# Patient Record
Sex: Female | Born: 1980 | Hispanic: No | Marital: Married | Smoking: Never smoker
Health system: Southern US, Community
[De-identification: ages and names within clinical notes are randomized; demographics above are authoritative.]

## PROBLEM LIST (undated history)

## (undated) DIAGNOSIS — L709 Acne, unspecified: Secondary | ICD-10-CM

## (undated) HISTORY — DX: Acne, unspecified: L70.9

---

## 2006-07-29 ENCOUNTER — Ambulatory Visit: Payer: Self-pay | Admitting: Infectious Diseases

## 2006-08-03 ENCOUNTER — Ambulatory Visit: Payer: Self-pay | Admitting: Infectious Diseases

## 2006-08-03 LAB — CONVERTED CEMR LAB
Albumin: 4.5 g/dL (ref 3.5–5.2)
Alkaline Phosphatase: 53 units/L (ref 39–117)
Basophils Absolute: 0.1 10*3/uL (ref 0.0–0.1)
CO2: 28 meq/L (ref 19–32)
Creatinine, Ser: 0.94 mg/dL (ref 0.40–1.20)
Eosinophils Absolute: 0.6 cells/mcL (ref 0.0–0.7)
Eosinophils Relative: 5 % (ref 0–5)
Glucose, Bld: 81 mg/dL (ref 70–99)
Hemoglobin: 14.4 g/dL (ref 12.0–15.0)
Leukocyte count, blood: 10.6 10*9/L — ABNORMAL HIGH (ref 4.0–10.5)
Lymphs Abs: 2.5 10*3/uL (ref 0.7–3.3)
Monocytes Absolute: 0.5 10*3/uL (ref 0.2–0.7)
Monocytes Relative: 5 % (ref 3–11)
Neutro Abs: 7 10*3/uL (ref 1.7–7.7)
Neutrophils Relative %: 66 % (ref 43–77)
RBC: 5.15 M/uL — ABNORMAL HIGH (ref 3.87–5.11)
Sodium: 141 meq/L (ref 135–145)
Total Bilirubin: 0.3 mg/dL (ref 0.3–1.2)

## 2006-09-06 ENCOUNTER — Ambulatory Visit: Payer: Self-pay | Admitting: Infectious Diseases

## 2006-09-07 ENCOUNTER — Ambulatory Visit: Payer: Self-pay | Admitting: Infectious Diseases

## 2006-09-14 ENCOUNTER — Ambulatory Visit: Payer: Self-pay | Admitting: Infectious Diseases

## 2006-10-14 ENCOUNTER — Encounter (INDEPENDENT_AMBULATORY_CARE_PROVIDER_SITE_OTHER): Payer: Self-pay | Admitting: Infectious Diseases

## 2007-02-06 ENCOUNTER — Emergency Department (HOSPITAL_COMMUNITY): Admission: EM | Admit: 2007-02-06 | Discharge: 2007-02-07 | Payer: Self-pay | Admitting: Emergency Medicine

## 2007-03-02 ENCOUNTER — Encounter: Admission: RE | Admit: 2007-03-02 | Discharge: 2007-03-21 | Payer: Self-pay | Admitting: Family Medicine

## 2008-08-07 ENCOUNTER — Other Ambulatory Visit: Admission: RE | Admit: 2008-08-07 | Discharge: 2008-08-07 | Payer: Self-pay | Admitting: Gynecology

## 2008-08-07 ENCOUNTER — Encounter: Payer: Self-pay | Admitting: Gynecology

## 2008-08-07 ENCOUNTER — Ambulatory Visit: Payer: Self-pay | Admitting: Gynecology

## 2008-08-10 ENCOUNTER — Ambulatory Visit: Payer: Self-pay | Admitting: Gynecology

## 2008-08-23 ENCOUNTER — Ambulatory Visit: Payer: Self-pay | Admitting: Gynecology

## 2008-08-23 ENCOUNTER — Ambulatory Visit (HOSPITAL_COMMUNITY): Admission: RE | Admit: 2008-08-23 | Discharge: 2008-08-23 | Payer: Self-pay | Admitting: Gynecology

## 2008-09-25 ENCOUNTER — Ambulatory Visit: Payer: Self-pay | Admitting: Gynecology

## 2008-10-26 ENCOUNTER — Ambulatory Visit: Payer: Self-pay | Admitting: Gynecology

## 2008-12-07 ENCOUNTER — Ambulatory Visit: Payer: Self-pay | Admitting: Gynecology

## 2009-07-26 ENCOUNTER — Ambulatory Visit: Payer: Self-pay | Admitting: Gynecology

## 2009-08-22 ENCOUNTER — Ambulatory Visit: Payer: Self-pay | Admitting: Gynecology

## 2009-08-22 ENCOUNTER — Other Ambulatory Visit: Admission: RE | Admit: 2009-08-22 | Discharge: 2009-08-22 | Payer: Self-pay | Admitting: Gynecology

## 2009-08-22 ENCOUNTER — Encounter: Payer: Self-pay | Admitting: Gynecology

## 2010-02-24 ENCOUNTER — Ambulatory Visit: Payer: Self-pay | Admitting: Gynecology

## 2010-02-27 ENCOUNTER — Ambulatory Visit: Payer: Self-pay | Admitting: Gynecology

## 2010-05-27 ENCOUNTER — Ambulatory Visit: Payer: Self-pay | Admitting: Gynecology

## 2010-06-23 ENCOUNTER — Ambulatory Visit: Payer: Self-pay | Admitting: Gynecology

## 2010-06-27 ENCOUNTER — Ambulatory Visit: Payer: Self-pay | Admitting: Gynecology

## 2010-07-15 ENCOUNTER — Ambulatory Visit: Payer: Self-pay | Admitting: Gynecology

## 2010-07-23 ENCOUNTER — Ambulatory Visit: Payer: Self-pay | Admitting: Gynecology

## 2010-07-26 ENCOUNTER — Observation Stay (HOSPITAL_COMMUNITY): Admission: EM | Admit: 2010-07-26 | Discharge: 2010-07-26 | Payer: Self-pay | Admitting: Emergency Medicine

## 2010-08-21 IMAGING — RF DG HYSTEROGRAM
5 series · 5 of 5 positions shown · IV contrast (omnipaque)
Comparison: none

CLINICAL DATA: Primary infertility.  Assess tubal patency.

HYSTEROSALPINGOGRAM
TECHNIQUE: Following cleansing of the cervix and vagina with
Betadine solution, a hysterosalpingogram was performed using a 5-
French hysterosalpingogram catheter and Omnipaque 300 contrast. The
patient tolerated the examination without difficulty.
Fluoroscopy time: 3.0 minutes.

[Series 1: run · 1 of 1 slices shown (1 of 5)]
[im 1/1]
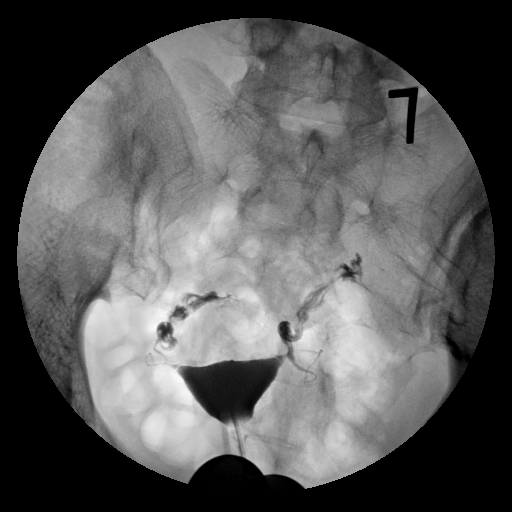

[Series 2: run · 1 of 1 slices shown (2 of 5)]
[im 1/1]
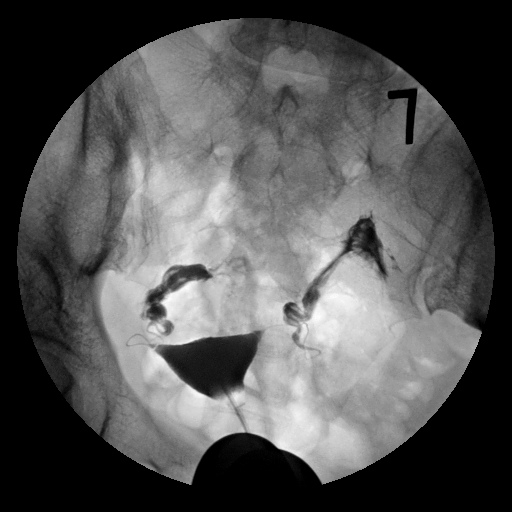

[Series 3: run · 1 of 1 slices shown (3 of 5)]
[im 1/1]
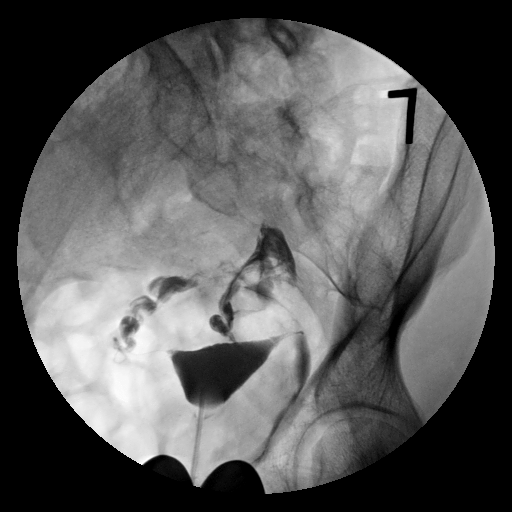

[Series 4: run · 1 of 1 slices shown (4 of 5)]
[im 1/1]
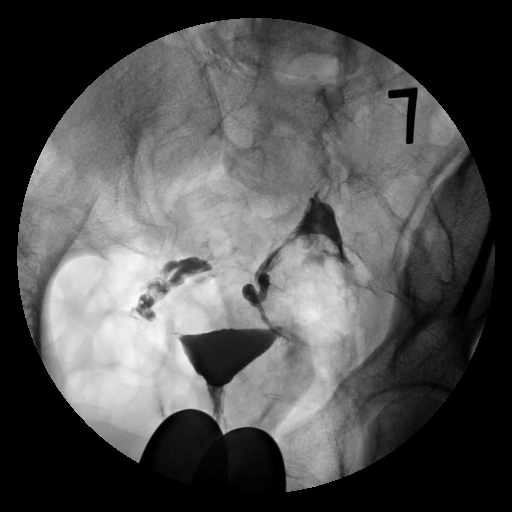

[Series 5: run · 1 of 1 slices shown (5 of 5)]
[im 1/1]
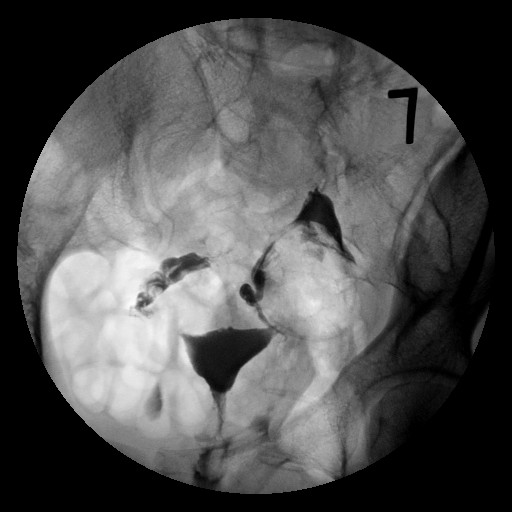

[5 of 5 positions shown; findings below may reference images not displayed]

FINDINGS: A normal endometrial morphology is noted.  Both fallopian
tubes have a normal morphology and bilateral free intraperitoneal
spill is documented.  No evidence for loculation of contrast was
seen to suggest the presence of peritubal or periovarian adhesions.
IMPRESSION: Normal HSG.

## 2010-12-24 LAB — POCT I-STAT, CHEM 8
BUN: 9 mg/dL (ref 6–23)
Calcium, Ion: 1.14 mmol/L (ref 1.12–1.32)
Chloride: 104 mEq/L (ref 96–112)
HCT: 46 % (ref 36.0–46.0)
Hemoglobin: 15.6 g/dL — ABNORMAL HIGH (ref 12.0–15.0)
Sodium: 141 mEq/L (ref 135–145)
TCO2: 19 mmol/L (ref 0–100)

## 2010-12-24 LAB — URINALYSIS, ROUTINE W REFLEX MICROSCOPIC
Bilirubin Urine: NEGATIVE
Glucose, UA: NEGATIVE mg/dL
Hgb urine dipstick: NEGATIVE
Ketones, ur: >80 mg/dL — AB
Nitrite: NEGATIVE
Specific Gravity, Urine: 1.02 (ref 1.005–1.030)

## 2010-12-24 LAB — HCG, QUANTITATIVE, PREGNANCY: hCG, Beta Chain, Quant, S: 90395 m[IU]/mL — ABNORMAL HIGH (ref <5)

## 2010-12-24 LAB — URINE CULTURE: Culture  Setup Time: 201110152058

## 2010-12-24 LAB — DIFFERENTIAL
Eosinophils Absolute: 0.2 10*3/uL (ref 0.0–0.7)
Lymphs Abs: 2.3 10*3/uL (ref 0.7–4.0)

## 2010-12-24 LAB — URINE MICROSCOPIC-ADD ON

## 2010-12-24 LAB — CBC
Hemoglobin: 13.8 g/dL (ref 12.0–15.0)
MCHC: 35.4 g/dL (ref 30.0–36.0)

## 2011-03-08 ENCOUNTER — Inpatient Hospital Stay (HOSPITAL_COMMUNITY)
Admission: AD | Admit: 2011-03-08 | Discharge: 2011-03-10 | DRG: 775 | Disposition: A | Discharge status: Home / Self Care | Payer: 59 | Source: Ambulatory Visit | Attending: Obstetrics & Gynecology | Admitting: Obstetrics & Gynecology

## 2011-03-08 DIAGNOSIS — O99892 Other specified diseases and conditions complicating childbirth: Secondary | ICD-10-CM | POA: Diagnosis present

## 2011-03-08 DIAGNOSIS — Z22330 Carrier of Group B streptococcus: Secondary | ICD-10-CM

## 2011-03-08 LAB — CBC
MCH: 26.2 pg (ref 26.0–34.0)
MCV: 78.9 fL (ref 78.0–100.0)
Platelets: 242 10*3/uL (ref 150–400)
RBC: 4.78 MIL/uL (ref 3.87–5.11)
WBC: 11 10*3/uL — ABNORMAL HIGH (ref 4.0–10.5)

## 2011-03-09 LAB — CBC
Hemoglobin: 9.2 g/dL — ABNORMAL LOW (ref 12.0–15.0)
MCH: 26 pg (ref 26.0–34.0)
MCHC: 33 g/dL (ref 30.0–36.0)

## 2011-10-10 ENCOUNTER — Encounter: Payer: Self-pay | Admitting: Emergency Medicine

## 2011-10-10 ENCOUNTER — Emergency Department (HOSPITAL_COMMUNITY)
Admission: EM | Admit: 2011-10-10 | Discharge: 2011-10-10 | Disposition: A | Discharge status: Home / Self Care | Payer: BC Managed Care – PPO | Source: Home / Self Care | Attending: Emergency Medicine | Admitting: Emergency Medicine

## 2011-10-10 DIAGNOSIS — N949 Unspecified condition associated with female genital organs and menstrual cycle: Secondary | ICD-10-CM

## 2011-10-10 DIAGNOSIS — N938 Other specified abnormal uterine and vaginal bleeding: Secondary | ICD-10-CM

## 2011-10-10 LAB — POCT URINALYSIS DIP (DEVICE)
Glucose, UA: NEGATIVE mg/dL
Urobilinogen, UA: 0.2 mg/dL (ref 0.0–1.0)
pH: 6.5 (ref 5.0–8.0)

## 2011-10-10 LAB — WET PREP, GENITAL
Clue Cells Wet Prep HPF POC: NONE SEEN
Yeast Wet Prep HPF POC: NONE SEEN

## 2011-10-10 LAB — POCT PREGNANCY, URINE: Preg Test, Ur: NEGATIVE

## 2011-10-10 MED ORDER — HYDROCODONE-ACETAMINOPHEN 5-325 MG PO TABS: 2.0000 | ORAL_TABLET | Freq: Once | ORAL | Status: DC

## 2011-10-10 NOTE — ED Notes (Signed)
Vaginal bleeding 12/14-12/29

## 2011-10-10 NOTE — ED Provider Notes (Signed)
History     CSN: [id number]  Arrival date & time 10/10/11  1725   First MD Initiated Contact with Patient 10/10/11 1834      No chief complaint on file.   (Consider location/radiation/quality/duration/timing/severity/associated sxs/prior treatment) Patient is a 30 y.o. female presenting with vaginal bleeding. The history is provided by the patient. No language interpreter was used.  Vaginal Bleeding This is a new problem. The current episode started more than 1 week ago. The problem occurs constantly. The problem has not changed since onset.The symptoms are aggravated by nothing. The symptoms are relieved by nothing. The treatment provided no relief.  Pt had a baby 7 months ago.  Pt complains of bleeding x 2 weeks.   No past medical history on file.  No past surgical history on file.  No family history on file.  History  Substance Use Topics  . Smoking status: Not on file  . Smokeless tobacco: Not on file  . Alcohol Use: Not on file    OB History    No data available      Review of Systems  Genitourinary: Positive for vaginal bleeding.  All other systems reviewed and are negative.    Allergies  Review of patient's allergies indicates not on file.  Home Medications  No current outpatient prescriptions on file.  BP 131/77  Pulse 66  Temp(Src) 98.2 F (36.8 C) (Oral)  Resp 17  SpO2 100%  Physical Exam  Nursing note and vitals reviewed. Constitutional: She is oriented to person, place, and time. She appears well-developed and well-nourished.  HENT:  Head: Normocephalic.  Eyes: Pupils are equal, round, and reactive to light.  Neck: Normal range of motion.  Cardiovascular: Normal rate.   Pulmonary/Chest: Effort normal.  Abdominal: Soft.  Genitourinary: Uterus normal.       Mod vaginal bleeding.  Endometrial tissue is protruding thru os.  (small amount) no mass,    Musculoskeletal: Normal range of motion.  Neurological: She is alert and oriented to  person, place, and time.  Skin: Skin is warm.  Psychiatric: She has a normal mood and affect.    ED Course  Procedures (including critical care time)  Labs Reviewed  POCT URINALYSIS DIP (DEVICE) - Abnormal; Notable for the following:    Hgb urine dipstick LARGE (*)    Protein, ur 30 (*)    All other components within normal limits  POCT PREGNANCY, URINE  POCT PREGNANCY, URINE  POCT URINALYSIS DIPSTICK  WET PREP, GENITAL  GC/CHLAMYDIA PROBE AMP, GENITAL   No results found.   1. Dysfunctional uterine bleeding       MDM  I advised pt to see Dr. Arlyce Dice for ebvaluation.  I advised [facility] to [facility] to [facility] if symptoms worsen or change.       Langston Masker, [place] 10/10/11 2021  Langston Masker, [place] 10/10/11 2022

## 2011-10-10 NOTE — ED Provider Notes (Signed)
Medical screening examination/treatment/procedure(s) were performed by non-physician practitioner and as supervising physician I was immediately available for consultation/collaboration.  Hillery Hunter, MD 10/10/11 2200

## 2011-10-10 NOTE — ED Notes (Signed)
Leah Monroe, emt assisted with pelvic exam 

## 2011-10-12 LAB — GC/CHLAMYDIA PROBE AMP, GENITAL: Chlamydia, DNA Probe: NEGATIVE

## 2013-05-27 ENCOUNTER — Ambulatory Visit (INDEPENDENT_AMBULATORY_CARE_PROVIDER_SITE_OTHER): Payer: BC Managed Care – PPO | Admitting: Family Medicine

## 2013-05-27 VITALS — BP 122/74 | HR 93 | Temp 98.6°F | Resp 18 | Ht 58.5 in | Wt 124.8 lb

## 2013-05-27 DIAGNOSIS — J069 Acute upper respiratory infection, unspecified: Secondary | ICD-10-CM

## 2013-05-27 MED ORDER — HYDROCODONE-HOMATROPINE 5-1.5 MG/5ML PO SYRP: 5.0000 mL | ORAL_SOLUTION | Freq: Three times a day (TID) | ORAL | Status: DC | PRN

## 2013-05-27 MED ORDER — AZITHROMYCIN 250 MG PO TABS: ORAL_TABLET | ORAL | Status: DC

## 2013-05-27 NOTE — Progress Notes (Signed)
Leah Monroe MRN: [id number], DOB: 01/14/1981, 32 y.o. Date of Encounter: 05/27/2013, 12:53 PM  Primary Physician: No PCP Per Patient  Chief Complaint:  Chief Complaint  Patient presents with  . Sore Throat    x 1 week    HPI: 32 y.o. year old female presents with a 7 day history of nasal congestion, post nasal drip, sore throat, and cough. Mild sinus pressure. Afebrile. No chills. Nasal congestion thick and green/yellow. Cough is productive of green/yellow sputum and not associated with time of day. Ears feel full, leading to sensation of muffled hearing. Has tried OTC cold preps without success. No GI complaints.   No sick contacts, recent antibiotics, or recent travels.   No leg trauma, sedentary periods, h/o cancer, or tobacco use.  History reviewed. No pertinent past medical history.   Home Meds: Prior to Admission medications   Medication Sig Start Date End Date Taking? Authorizing Provider  azithromycin (ZITHROMAX Z-PAK) 250 MG tablet Take as directed on pack 05/27/13   Elvina Sidle, MD  HYDROcodone-homatropine [facility]) 5-1.5 MG/5ML syrup Take 5 mL by mouth every 8 (eight) hours as needed for cough. 05/27/13   Elvina Sidle, MD    Allergies: No Known Allergies  History   Social History  . Marital Status: Married    Spouse Name: N/A    Number of Children: N/A  . Years of Education: N/A   Occupational History  . Not on file.   Social History Main Topics  . Smoking status: Never Smoker   . Smokeless tobacco: Not on file  . Alcohol Use: No  . Drug Use: No  . Sexual Activity: Not on file   Other Topics Concern  . Not on file   Social History Narrative  . No narrative on file     Review of Systems: Constitutional: negative for chills, fever, night sweats or weight changes Cardiovascular: negative for chest pain or palpitations Respiratory: negative for hemoptysis, wheezing, or shortness of breath Abdominal: negative for abdominal pain, nausea, vomiting or  diarrhea Dermatological: negative for rash Neurologic: negative for headache   Physical Exam: Blood pressure 122/74, pulse 93, temperature 98.6 F (37 C), temperature source Oral, resp. rate 18, height 4' 10.5" (1.486 m), weight 124 lb 12.8 oz (56.609 kg), last menstrual period 05/20/2013, SpO2 100.00%., Body mass index is 25.64 kg/(m^2). General: Well developed, well nourished, in no acute distress. Head: Normocephalic, atraumatic, eyes without discharge, sclera non-icteric, nares are congested. Bilateral auditory canals clear, TM's are without perforation, pearly grey with reflective cone of light bilaterally. No sinus TTP. Oral cavity moist, dentition normal. Posterior pharynx with post nasal drip and mild erythema. No peritonsillar abscess or tonsillar exudate. Neck: Supple. No thyromegaly. Full ROM. No lymphadenopathy. Lungs: Coarse breath sounds bilaterally without wheezes, rales, or rhonchi. Breathing is unlabored.  Heart: RRR with S1 S2. No murmurs, rubs, or gallops appreciated. Msk:  Strength and tone normal for age. Extremities: No clubbing or cyanosis. No edema. Neuro: Alert and oriented X 3. Moves all extremities spontaneously. CNII-XII grossly in tact. Psych:  Responds to questions appropriately with a normal affect.   Labs:   ASSESSMENT AND PLAN:  32 y.o. year old female with bronchitis and pharyngitis, worsening, x 1 week URI, acute - Plan: HYDROcodone-homatropine (HYCODAN) 5-1.5 MG/5ML syrup, azithromycin (ZITHROMAX Z-PAK) 250 MG tablet, DISCONTINUED: azithromycin (ZITHROMAX Z-PAK) 250 MG tablet  Signed, Elvina Sidle, MD   - -Tylenol/Motrin prn -Rest/fluids -RTC precautions -RTC 3-5 days if no improvement  Signed, Elvina Sidle, MD 05/27/2013  12:53 PM

## 2014-10-06 ENCOUNTER — Ambulatory Visit (INDEPENDENT_AMBULATORY_CARE_PROVIDER_SITE_OTHER): Payer: BC Managed Care – PPO | Admitting: Family Medicine

## 2014-10-06 VITALS — BP 110/70 | HR 75 | Temp 98.1°F | Resp 16 | Ht 59.0 in | Wt 125.8 lb

## 2014-10-06 DIAGNOSIS — N926 Irregular menstruation, unspecified: Secondary | ICD-10-CM

## 2014-10-06 LAB — POCT CBC
GRANULOCYTE PERCENT: 60.6 % (ref 37–80)
HEMATOCRIT: 44.9 % (ref 37.7–47.9)
HEMOGLOBIN: 14.4 g/dL (ref 12.2–16.2)
LYMPH, POC: 2.3 (ref 0.6–3.4)
MCH: 27.5 pg (ref 27–31.2)
MCHC: 32 g/dL (ref 31.8–35.4)
MCV: 85.9 fL (ref 80–97)
MID (cbc): 0.4 (ref 0–0.9)
MPV: 8.2 fL (ref 0–99.8)
POC Granulocyte: 4.2 (ref 2–6.9)
POC LYMPH %: 33 % (ref 10–50)
POC MID %: 6.4 %M (ref 0–12)
Platelet Count, POC: 229 10*3/uL (ref 142–424)
RBC: 5.23 M/uL (ref 4.04–5.48)
RDW, POC: 14.1 %
WBC: 7 10*3/uL (ref 4.6–10.2)

## 2014-10-06 LAB — POCT URINE PREGNANCY: PREG TEST UR: NEGATIVE

## 2014-10-06 MED ORDER — DESOGESTREL-ETHINYL ESTRADIOL 0.15-30 MG-MCG PO TABS: 1.0000 | ORAL_TABLET | Freq: Every day | ORAL | Status: DC

## 2014-10-06 NOTE — Progress Notes (Signed)
Subjective:    Patient ID: Leah Monroe, female    DOB: 05/15/1981, 33 y.o.   MRN: [id number]  10/06/2014  Menstrual Problem   HPI This 33 y.o. female presents for evaluation of irregular vaginal bleeding.  Onset of bleeding 09/12/2014; continues to bleed.  History of irregular vaginal bleeding in past in 2012.  Since 2012 episode, has had regular menses.  Normally, menses last 3-4 days; medium flow; intermittent cramping.  LMP before December was 08-19-2014 normal.  Current bleeding is normal but has continued; no heavy bleeding; no cramping.  +Daily bleeding.  No vaginal discharge, itching, burning.  +Married;no new sexual partners.  No contraception; desires conception in future.  No abdominal pain.  Last pap smear 2012 and reports normal.  No other sites of bleeding. One child; four year old child.    PCP:  None Gynecologist: none   Review of Systems  Constitutional: Negative for fever, chills, diaphoresis and fatigue.  HENT: Negative for nosebleeds.   Gastrointestinal: Negative for nausea, vomiting, abdominal pain and blood in stool.  Genitourinary: Positive for vaginal bleeding and menstrual problem. Negative for dysuria, urgency, frequency, hematuria, flank pain, decreased urine volume, vaginal discharge, genital sores, vaginal pain and pelvic pain.  Hematological: Does not bruise/bleed easily.    History reviewed. No pertinent past medical history. History reviewed. No pertinent past surgical history. No Known Allergies      Objective:    BP 110/70 mmHg  Pulse 75  Temp(Src) 98.1 F (36.7 C) (Oral)  Resp 16  Ht 4\' 11"  (1.499 m)  Wt 125 lb 12.8 oz (57.063 kg)  BMI 25.40 kg/m2  SpO2 100%  LMP 09/11/2014 Physical Exam  Constitutional: She is oriented to person, place, and time. She appears well-developed and well-nourished. No distress.  HENT:  Head: Normocephalic and atraumatic.  Eyes: Conjunctivae are normal. Pupils are equal, round, and reactive to light.  Neck: Normal  range of motion. Neck supple.  Cardiovascular: Normal rate, regular rhythm and normal heart sounds.  Exam reveals no gallop and no friction rub.   No murmur heard. Pulmonary/Chest: Effort normal and breath sounds normal. She has no wheezes. She has no rales.  Abdominal: Soft. Bowel sounds are normal. She exhibits no distension and no mass. There is no tenderness. There is no rebound and no guarding.  Genitourinary: Vagina normal and uterus normal. There is no rash, tenderness, lesion or injury on the right labia. There is no rash, tenderness, lesion or injury on the left labia. Cervix exhibits no motion tenderness, no discharge and no friability. Right adnexum displays no mass, no tenderness and no fullness. Left adnexum displays no mass, no tenderness and no fullness. No erythema or tenderness in the vagina. No foreign body around the vagina. No signs of injury around the vagina. No vaginal discharge found.  +bleeding from cervical os.  Neurological: She is alert and oriented to person, place, and time.  Skin: She is not diaphoretic.  Psychiatric: She has a normal mood and affect. Her behavior is normal.  Nursing note and vitals reviewed.  Results for orders placed or performed in visit on 10/06/14  GC/Chlamydia Probe Amp  Result Value Ref Range   CT Probe RNA NEGATIVE    GC Probe RNA NEGATIVE   POCT CBC  Result Value Ref Range   WBC 7.0 4.6 - 10.2 K/uL   Lymph, poc 2.3 0.6 - 3.4   POC LYMPH PERCENT 33.0 10 - 50 %L   MID (cbc) 0.4 0 - 0.9  POC MID % 6.4 0 - 12 %M   POC Granulocyte 4.2 2 - 6.9   Granulocyte percent 60.6 37 - 80 %G   RBC 5.23 4.04 - 5.48 M/uL   Hemoglobin 14.4 12.2 - 16.2 g/dL   HCT, POC 91.444.9 78.237.7 - 47.9 %   MCV 85.9 80 - 97 fL   MCH, POC 27.5 27 - 31.2 pg   MCHC 32.0 31.8 - 35.4 g/dL   RDW, POC 95.614.1 %   Platelet Count, POC 229 142 - 424 K/uL   MPV 8.2 0 - 99.8 fL  POCT urine pregnancy  Result Value Ref Range   Preg Test, Ur Negative        Assessment & Plan:    1. Menstrual periods irregular      1. Irregular menstrual cycle: New. In 33 year old.  Refer to gynecology for pap smear, endometrial biopsy, pelvic ultrasound.  Rx for Apri provided to take for one month to stop menses.  Hemodynamically stable.  Desires conception in future.   Meds ordered this encounter  Medications  . desogestrel-ethinyl estradiol (APRI,EMOQUETTE,SOLIA) 0.15-30 MG-MCG tablet    Sig: Take 1 tablet by mouth daily.    Dispense:  1 Package    Refill:  0    No Follow-up on file.     Nilda SimmerKristi Bijal Siglin, M.D.  [facility]  [place] [address] [place], [place]  [id number] [phone] phone [phone] fax

## 2014-10-08 LAB — GC/CHLAMYDIA PROBE AMP
CT PROBE, AMP APTIMA: NEGATIVE
GC Probe RNA: NEGATIVE

## 2014-10-18 ENCOUNTER — Other Ambulatory Visit (HOSPITAL_COMMUNITY)
Admission: RE | Admit: 2014-10-18 | Discharge: 2014-10-18 | Disposition: A | Discharge status: Home / Self Care | Payer: BLUE CROSS/BLUE SHIELD | Source: Ambulatory Visit | Attending: Gynecology | Admitting: Gynecology

## 2014-10-18 ENCOUNTER — Encounter: Payer: Self-pay | Admitting: Women's Health

## 2014-10-18 ENCOUNTER — Ambulatory Visit (INDEPENDENT_AMBULATORY_CARE_PROVIDER_SITE_OTHER): Payer: BLUE CROSS/BLUE SHIELD | Admitting: Women's Health

## 2014-10-18 VITALS — BP 120/80 | Ht 59.0 in | Wt 125.0 lb

## 2014-10-18 DIAGNOSIS — N926 Irregular menstruation, unspecified: Secondary | ICD-10-CM

## 2014-10-18 DIAGNOSIS — Z01419 Encounter for gynecological examination (general) (routine) without abnormal findings: Secondary | ICD-10-CM | POA: Insufficient documentation

## 2014-10-18 DIAGNOSIS — Z833 Family history of diabetes mellitus: Secondary | ICD-10-CM

## 2014-10-18 LAB — TSH: TSH: 1.812 u[IU]/mL (ref 0.350–4.500)

## 2014-10-18 LAB — GLUCOSE, RANDOM: GLUCOSE: 80 mg/dL (ref 70–99)

## 2014-10-18 MED ORDER — MEDROXYPROGESTERONE ACETATE 10 MG PO TABS: 10.0000 mg | ORAL_TABLET | Freq: Every day | ORAL | Status: DC

## 2014-10-18 NOTE — Progress Notes (Signed)
Leah Monroe [birth year] [id number]    History:    Presents for annual exam.  [place], interpreter Leah Monroe. Regular monthly cycle/withdrawal for contraception until December 2015 irregular bleeding most days month of December. Was seen at [facility] urgent care 10/06/2014 had a negative UPT, negative GC/Chlamydia and was placed on Mircette. Has continued to bleed for the past 2 weeks daily on Mircette. Normal Pap 2010, vaginal delivery 20119, son is 16 years old Dr. Deatra Monroe delivered.  Past medical history, past surgical history, family history and social history were all reviewed and documented in the EPIC chart. Works in a [employer], parents live in [place]. Denies family history of diabetes, heart disease or breast cancer.  ROS:  A ROS was performed and pertinent positives and negatives are included.  Exam:  Filed Vitals:   10/18/14 1149  BP: 120/80    General appearance:  Normal Thyroid:  Symmetrical, normal in size, without palpable masses or nodularity. Respiratory  Auscultation:  Clear without wheezing or rhonchi Cardiovascular  Auscultation:  Regular rate, without rubs, murmurs or gallops  Edema/varicosities:  Not grossly evident Abdominal  Soft,nontender, without masses, guarding or rebound.  Liver/spleen:  No organomegaly noted  Hernia:  None appreciated  Skin  Inspection:  Grossly normal   Breasts: Examined lying and sitting.     Right: Without masses, retractions, discharge or axillary adenopathy.     Left: Without masses, retractions, discharge or axillary adenopathy. Gentitourinary   Inguinal/mons:  Normal without inguinal adenopathy  External genitalia:  Normal  BUS/Urethra/Skene's glands:  Normal  Vagina:  Normal  Cervix:  Normal  Uterus:   normal in size, shape and contour.  Midline and mobile  Adnexa/parametria:     Rt: Without masses or tenderness.   Lt: Without masses or tenderness.  Anus and perineum: Normal  Digital rectal exam: Normal sphincter tone without  palpated masses or tenderness  Assessment/Plan:  34 y.o. MAF G1P1 for annual exam DUB 1 month  DUB Contraception management  Plan: Qualitative hCG, if negative Provera 10 daily for 10 days. Instructed to call or return if cycles do not regulate for further testing/sonohysterogram. Contraception options reviewed and declined will continue with withdrawal. Contemplating second pregnancy next year. Healthy pregnancy behaviors reviewed. Prenatal vitamin daily encouraged. SBE's, exercise, calcium rich diet encouraged. TSH, glucose, UA, Pap with HR HPV typing.   Huel Cote [facility], 12:46 PM 10/18/2014

## 2014-10-18 NOTE — Patient Instructions (Signed)
Xu?t Huy?t T? Cung Do R?i Lo?n Ch?c Nng (Dysfunctional Uterine Bleeding) Thng th??ng chu k? kinh nguy?t b?t ??u ? l?a tu?i 11 ??n 17 ? cc em gi. M?t k? kinh nguy?t bnh th??ng c th? b?t ??u m?i 21 ngy ??n 33 ngy m?t l?n v ko di 1 ??n 7 ngy. Kho?ng 12 ??n 14 ngy tr??c khi k? kinh nguy?t b?t ??u, s? r?ng tr?ng (bu?ng tr?ng t?o tr?ng) xu?t hi?n. Khi tnh th?i gian gi?a cc k? kinh nguy?t, tnh t? ngy ??u tin ch?y mu c?a chu k? tr??c ? ??n ngy ??u tin ch?y mu c?a chu k? ti?p theo. .  Xu?t huy?t t? cung do r?i lo?n ch?c n?ng (b?t th??ng) khc v?i ch?y mu trong k? kinh nguy?t bnh th??ng. K? kinh nguy?t c th? ??n s?m h?n ho?c mu?n h?n bnh th??ng. Chng c th? nh? h?n, c c?c mu ?ng ho?c n?ng h?n. B?n c th? b? ch?y mu gi?a cc k? kinh nguy?t, ho?c b?n c th? khng c m?t ho?c nhi?u k? kinh nguy?t. B?n c th? b? ch?y mu sau khi quan h? tnh d?c, ch?y mu sau khi mn kinh, ho?c khng c k? kinh nguy?t. NGUYN NHN  Mang thai (bnh th??ng, s?y [place], th? tinh trong ?ng nghi?m).  IUD (d?ng c? t? cung, trnh [place]).  Thu?c trnh [place].  i?u tr? hooc-mn.  Mn kinh.  Nhi?m trng c? t? cung.  V?n ?? ?ng mu.  Nhi?m trng nim m?c t? cung.  B?nh l?c n?i m?c t? cung, nim m?c t? cung pht tri?n trong khung ch?u v cc c? quan khc c?a ph? n?.  Dnh (m s?o) bn trong t? cung.  Bo ph ho?c gi?m cn nghim tr?ng.  Polip t? cung bn trong t? cung.  Ung th? m ??o, c? t? cung ho?c t? cung.  U nang bu?ng tr?ng hay h?i ch?ng bu?ng tr?ng ?a nang.  B?nh l (b?nh ti?u ???ng, b?nh tuy?n gip).  U x? t? cung (kh?i u khng ph?i ung th?).  V?n ?? v?i cc hooc-mn n?.  T?ng s?n n?i m?c t? cung, nim m?c r?t dy v cc t? bo ph ??i bn trong c?a t? cung.  Thu?c ?nh h??ng ??n s? r?ng tr?ng.  X? tr? ? vng khung ch?u ho?c b?ng.  Ha tr? li?u. CH?N ON  Bc s? c?a b?n s? th?o lu?n v? ti?n s? chu k? kinh nguy?t c?a b?n, cc lo?i thu?c b?n ?ang dng, thay ??i tr?ng l??ng  c?a b?n, c?ng th?ng trong cu?c s?ng c?a b?n v b?t k? v?n ?? n?i khoa no m b?n c th? c.  Bc s? c?a b?n s? ti?n hnh khm th?c th? v ki?m tra vng ch?u.  Bc s? c?a b?n c th? mu?n th?c hi?n m?t s? xt nghi?m ?? ch?n ?on, ch?ng h?n nh?:  Xt nghi?m pap.  Xt nghi?m mu.  C?y nhi?m trng.  Ch?p CT.  Siu m.  Soi t? cung.  Soi ? b?ng.  MRI.  Ch?p X quang vi t? cung.  D v C.  Sinh thi?t n?i m?c t? cung. I?U TR? Vi?c ?i?u tr? s? ph? thu?c vo nguyn nhn gy xu?t huy?t t? cung do r?i lo?n ch?c nng (DUB). i?u tr? c th? bao g?m:  Theo di cc chu k? kinh nguy?t c?a b?n trong vi thng.  K ??n thu?c ?i?u tr? cc v?n ?? n?i khoa, bao g?m:  Khng sinh.  Hooc-mn.  Thu?c trnh [place].  Tho d?ng c? trnh [place] (d?ng c?  t? cung, trnh [place]).  Ph?u thu?t:  D v C (n?o v lo?i b? m bn trong t? cung).  Soi ? b?ng (ki?m tra bn trong vng b?ng b?ng m?t ?ng c ?n).  C?t b? t? cung (ph h?y nim m?c t? cung b?ng dng ?i?n, laser, nhi?t ho?c ?ng l?nh).  Soi t? cung (ki?m tra c? t? cung v t? cung b?ng m?t ?ng c ?n).  C?t b? t? cung. H??NG D?N CH?M [place] T?I NH  N?u thu?c ???c ch? ??nh, hy s? d?ng theo ?ng ch? d?n. Khng thay ??i ho?c chuy?n lo?i thu?c m khng tham kh?o  ki?n c?a chuyn gia chm [place] s?c kh?e.  Ch?y mu n?ng ko di c th? d?n ??n thi?u s?t. Chuyn gia ch?m [place] s?c kh?e c?a b?n c th? k ??n dng vin s?t. Chng gip thay th? s?t m c? th? b?n b? m?t do m?t mu nhi?u. S? d?ng theo ?ng ch? d?n.  Khng s? d?ng aspirin ho?c thu?c c ch?a aspirin m?t tu?n tr??c ho?c trong k? kinh nguy?t. Aspirin c th? lm cho ch?y mu t?i t? h?n.  N?u b?n c?n thay b?ng v? sinh nhi?u h?n m?t l?n m?i 2 gi?, hy n?m trn gi??ng v?i bn chn gi? cao v m?t gi ch??m l?nh trn b?ng d??i. Ngh? ng?i cng nhi?u cng t?t, cho ??n khi ch?y mu ng?ng ho?c ch?m l?i.  ?n nh?ng b?a ?n cn ??i. ?n th?c ph?m giu ch?t s?t. V d? nh?:  Rau l xanh.  Bnh m v ng? c?c  nguyn h?t.  Tr?ng.  Th?t.  Marchelle GearingGan.  Khng c? g?ng gi?m cn cho ??n khi ch?y mu b?t th??ng ? d?ng l?i v hm l??ng s?t trong mu c?a b?n tr? l?i bnh th??ng. Khng nh?c qu m??i pao ho?c th?c hi?n cc ho?t ??ng g?ng s?c khi ?ang ch?y mu.  Trong m?t vi thng, hy ghi chp l?i vo l?ch c?a b?n, dnh d?u lc b?t ??u v k?t thc k? kinh nguy?t c?a b?n v cc lo?i ch?y mu (nh?, trung bnh, n?ng, ??m, c?c ho?c khng c kinh). Th?i gian ny s? gip chuyn gia ch?m [place] s?c kh?e ?nh gi v?n ?? c?a b?n t?t h?n. HY ?I KHM N?U:  B?n b? bu?n nn (c?m th?y kh ch?u trong d? dy) v nn m?a, hoa m?t ho?c tiu ch?y trong khi s? d?ng thu?c.  B?n b? chng m?t ho?c y?u.  B?n c b?t k? v?n ?? no lin quan ??n thu?c ?ang s? d?ng.  B?n b? khi c DUB.  B?n mu?n tho IUD.  B?n mu?n d?ng l?i ho?c thay ?i thu?c trnh [place] ho?c hooc-mn c?a b?n.  B?n c b?t c? lo?i ch?y mu b?t th??ng no ???c ?? c?p ? trn.  B?n trn 14 tu?i v ch?a c kinh nguy?t.  B?n ? tu?i 53 v v?n c kinh nguy?t.  B?n c b?t k? tri?u ch?ng no nu trn.  B?n b? pht ban. HY NGAY L?P T?C ?I KHM N?U:  Nhi?t ?? ? mi?ng cao trn 38,9 C (102 F).  B?n pht tri?n ?n l?nh.  B?n thay b?ng v? sinh nhi?u h?n m?t l?n m?i gi?.  B?n pht tri?n ?au b?ng.  B?n b? ng?t. Document Released: 09/28/2005 Document Revised: 05/31/2013 [facility] Patient Information 2015 [place], [place]. This information is not intended to replace advice given to you by your health care provider. Make sure you discuss any questions you have with your health care provider.

## 2014-10-19 LAB — URINALYSIS W MICROSCOPIC + REFLEX CULTURE
BILIRUBIN URINE: NEGATIVE
Casts: NONE SEEN
Crystals: NONE SEEN
Glucose, UA: NEGATIVE mg/dL
KETONES UR: NEGATIVE mg/dL
NITRITE: NEGATIVE
PH: 6.5 (ref 5.0–8.0)
Protein, ur: NEGATIVE mg/dL
SQUAMOUS EPITHELIAL / LPF: NONE SEEN
Specific Gravity, Urine: 1.005 (ref 1.005–1.030)
Urobilinogen, UA: 0.2 mg/dL (ref 0.0–1.0)

## 2014-10-19 LAB — HCG, QUANTITATIVE, PREGNANCY: hCG, Beta Chain, Quant, S: <2 m[IU]/mL

## 2014-10-22 ENCOUNTER — Other Ambulatory Visit: Payer: Self-pay | Admitting: Women's Health

## 2014-10-22 LAB — URINE CULTURE

## 2014-10-22 LAB — CYTOLOGY - PAP

## 2014-10-22 MED ORDER — SULFAMETHOXAZOLE-TRIMETHOPRIM 800-160 MG PO TABS: 1.0000 | ORAL_TABLET | Freq: Two times a day (BID) | ORAL | Status: DC

## 2014-11-01 ENCOUNTER — Ambulatory Visit: Payer: BC Managed Care – PPO | Admitting: Gynecology

## 2015-06-22 ENCOUNTER — Ambulatory Visit (INDEPENDENT_AMBULATORY_CARE_PROVIDER_SITE_OTHER): Payer: BLUE CROSS/BLUE SHIELD | Admitting: Family Medicine

## 2015-06-22 VITALS — BP 120/78 | HR 81 | Temp 98.4°F | Resp 16 | Ht <= 58 in | Wt 114.0 lb

## 2015-06-22 DIAGNOSIS — R05 Cough: Secondary | ICD-10-CM

## 2015-06-22 DIAGNOSIS — J029 Acute pharyngitis, unspecified: Secondary | ICD-10-CM

## 2015-06-22 DIAGNOSIS — R059 Cough, unspecified: Secondary | ICD-10-CM

## 2015-06-22 DIAGNOSIS — R197 Diarrhea, unspecified: Secondary | ICD-10-CM

## 2015-06-22 MED ORDER — LEVOFLOXACIN 500 MG PO TABS: 500.0000 mg | ORAL_TABLET | Freq: Every day | ORAL | Status: DC

## 2015-06-22 NOTE — Patient Instructions (Signed)
Get Imodium from pharmacy and take three times a day as needed.   Tiu ch?y (Diarrhea) Tiu ch?y l hi?n t??ng ?i ngoi th??ng xuyn, phn l?ng v ton n??c. N c th? khi?n b?n c?m th?y m?t m?i v m?t n??c. M?t n??c c th? khi?n b?n tr? nn m?t m?i v kht n??c, kh mi?ng, l??ng n??c ti?u gi?m v n??c ti?u th??ng c mu vng ??m. Tiu ch?y l m?t d?u hi?u c?a m?t v?n ?? khc, th??ng l m?t b?nh nhi?m trng s? khng ko di. Trong h?u h?t cc tr??ng h?p, tiu ch?y th??ng ko di 2-3 ngy. Tuy nhin, n c th? ko di lu h?n n?u n l m?t d?u hi?u c?a m?t b?nh khc nghim tr?ng h?n. ?i?u quan tr?ng l c?n ?i?u tr? tiu ch?y theo ch? d?n c?a chuyn gia ch?m [place] s?c kh?e ?? lm gi?m b?t ho?c ng?n ch?n cc ??t tiu ch?y trong t??ng lai. NGUYN NHN M?t s? nguyn nhn ph? bi?n bao g?m:  Nhi?m trng ???ng tiu ha do vi rt, vi khu?n ho?c k sinh trng gy ra.  Ng? ??c ho?c d? ?ng v?i th?c ph?m.  M?t s? lo?i thu?c nh?t ??nh nh? thu?c khng sinh, ha tr? li?u v thu?c nhu?n trng.  Ch?t lm ng?t nhn t?o v fructoza.  R?i lo?n tiu ha. H??NG D?N CH?M [place] T?I NH  ??m b?o u?ng ?? n??c (hydrat ha): u?ng 1 c?c (8 oz) n??c cho m?i l?n tiu ch?y. [place] cc ch?t l?ng c ch?a ???ng ??n ho?c ?? u?ng trong th? thao, n??c tri cy, s?n ph?m s?a nguyn ch?t v s ?a. N??c ti?u c?a b?n ph?i trong ho?c c mu vng nh?t n?u b?n u?ng ?? n??c. B n??c b?ng dung d?ch b n??c ???ng u?ng m b?n c th? mua t?i cc hi?u thu?c, c?a hng bn l? v tr?c tuy?n. B?n c th? t? pha ch? dung d?ch b n??c ???ng u?ng t?i nh b?ng cch tr?n cc thnh ph?n sau vo v?i nhau:  ?-? mu?ng mu?i.   mu?ng so?a bicacbonat.  ? mu?ng ch?t thay th? mu?i c ch?a kali clorua.  1 ? mu?ng ???ng.  1 L (34 oz) n??c.  M?t s? th?c ph?m v ?? u?ng nh?t ??nh c th? lm t?ng t?c ?? di chuy?n c?a th?c ?n trong ???ng tiu ha (GI). Nn trnh nh?ng th?c ?n v ?? u?ng ny v bao g?m:  ?? u?ng c ch?a caffeine v ?? u?ng c ch?a c?n.  Cc  lo?i th?c ph?m nhi?u ch?t x?, ch?ng h?n nh? tri cy t??i v rau qu?, cc lo?i h?t, bnh m v ng? c?c nguyn h?t.  Th?c ph?m v ?? u?ng ???c lm ng?t b?ng r??u ???ng, ch?ng h?n nh? xylitol, sorbitol v mannitol.  M?t s? th?c ph?m c th? ???c dung n?p t?t v c th? gip lm cho phn ??c bao g?m:  Cc lo?i th?c ph?m giu tinh b?t nh? c?m, bnh m, m ?ng, ng? c?c t ???ng, b?t y?n m?ch, y?n m?ch, khoai ty n??ng, bnh quy gin v bnh m trn.  Chu?i.  To th?ng n??c ???ng.  Thm th?c ph?m giu probiotic ?? gip t?ng vi khu?n c l?i cho s?c kh?e trong ???ng ru?t, ch?ng h?n nh? s?a chua v cc s?n ph?m s?a ln men.  R?a tay th?t k? sau m?i l?n tiu ch?y.  Ch? s? d?ng thu?c khng c?n k toa ho?c thu?c c?n k toa theo ch? d?n c?a chuyn gia ch?m [place] s?c kh?e.  T?m n??c ?m ??  gi?m c?m gic rt b?ng ho?c ?au do tiu ch?y th??ng xuyn. HY NGAY L?P T?C ?I KHM N?U:  B?n khng th? gi? ch?t l?ng trong ng??i.  B?n lin t?c nn m?a.  B?n ?i ngoi ra mu ho?c phn c mu ?en v h?c n.  B?n khng ?i ti?u trong 6-8 gi?, ho?c ch? c m?t l??ng nh? n??c ti?u c mu r?t s?m.  B?n b? ?au b?ng gia t?ng ho?c ?au t?i ch?.  B?n th?y y?u, chng m?t, l l?n ho?c chong vng.  B?n b? ?au ??u n?ng.  Tiu ch?y n?ng h?n ho?c khng ?? h?n.  B?n b? s?t ho?c c cc tri?u ch?ng ko di h?n 2-3 ngy.  B?n b? s?t v cc tri?u ch?ng c?a b?n ??t nhin x?u ?i. ??M B?O B?N:  Hi?u cc h??ng d?n ny.  S? theo di tnh tr?ng c?a mnh.  S? yu c?u tr? gip ngay l?p t?c n?u b?n c?m th?y khng ?? ho?c tnh tr?ng tr?m tr?ng h?n. Document Released: 01/13/2011 Document Revised: 05/31/2013 [facility] Patient Information 2015 [place], [place]. This information is not intended to replace advice given to you by your health care provider. Make sure you discuss any questions you have with your health care provider.

## 2015-06-22 NOTE — Progress Notes (Signed)
@UMFCLOGO @  This chart was scribed for Elvina Sidle, MD by Andrew Au, ED Scribe. This patient was seen in room 9 and the patient's care was started at 1:31 PM.  Patient ID: Leah Monroe MRN: [id number], DOB: 30-May-1981, 34 y.o. Date of Encounter: 06/22/2015, 1:28 PM  Primary Physician: No PCP Per Patient  Chief Complaint:  Chief Complaint  Patient presents with  . Abdominal Pain    Diarrhea  x 3 days  . Cough    x 2-3 weeks mostly at night time   HPI: 34 y.o. year old female with history below presents with diarrhea for 3 days. Pt reports diarrhea after eating along with generalized abdominal cramping. She also has cough and sore throat. She has not treated her symptoms at home. She denies blood in stool.  History reviewed. No pertinent past medical history.   Home Meds: Prior to Admission medications   Not on File    Allergies: No Known Allergies  Social History   Social History  . Marital Status: Married    Spouse Name: N/A  . Number of Children: 1  . Years of Education: N/A   Occupational History  . Not on file.   Social History Main Topics  . Smoking status: Never Smoker   . Smokeless tobacco: Not on file  . Alcohol Use: No  . Drug Use: No  . Sexual Activity: Yes    Birth Control/ Protection: None   Other Topics Concern  . Not on file   Social History Narrative     Review of Systems: Constitutional: negative for chills, fever, night sweats, weight changes, or fatigue  HEENT: negative for vision changes, hearing loss, congestion, rhinorrhea, ST, epistaxis, or sinus pressure. Positive sore throat.  Cardiovascular: negative for chest pain or palpitations Respiratory: negative for hemoptysis, wheezing, shortness of breath. Positive cough.  Abdominal: negative for nausea, vomiting, or constipation. Positive diarrhea and abdominal pain.  Dermatological: negative for rash Neurologic: negative for headache, dizziness, or syncope All other systems reviewed and  are otherwise negative with the exception to those above and in the HPI.   Physical Exam: Blood pressure 120/78, pulse 81, temperature 98.4 F (36.9 C), temperature source Oral, resp. rate 16, height 4\' 10"  (1.473 m), weight 114 lb (51.71 kg), last menstrual period 06/07/2015, SpO2 100 %., Body mass index is 23.83 kg/(m^2). General: Well developed, well nourished, in no acute distress. Head: Normocephalic, atraumatic, eyes without discharge, sclera non-icteric, nares are without discharge. Bilateral auditory canals clear, TM's are without perforation, pearly grey and translucent with reflective cone of light bilaterally. Oral cavity moist, posterior pharynx is red without exudate, erythema, peritonsillar abscess, or post nasal drip. Neck: Supple. No thyromegaly. Full ROM. No lymphadenopathy. Lungs: Clear bilaterally to auscultation without wheezes, rales. Bilateral rhonchi. Breathing is unlabored. Heart: RRR with S1 S2. No murmurs, rubs, or gallops appreciated. Abdomen: Soft, no localized tenderness, non-distended with normoactive bowel sounds. No hepatomegaly. No rebound/guarding. No obvious abdominal masses. Msk:  Strength and tone normal for age. Increased bowel sounds. Extremities/Skin: Warm and dry. No clubbing or cyanosis. No edema. No rashes or suspicious lesions. Neuro: Alert and oriented X 3. Moves all extremities spontaneously. Gait is normal. CNII-XII grossly in tact. Psych:  Responds to questions appropriately with a normal affect.    ASSESSMENT AND PLAN:  34 y.o. year old female with  This chart was scribed in my presence and reviewed by me personally.    ICD-9-CM ICD-10-CM   1. Cough 786.2 R05 levofloxacin (LEVAQUIN) 500  MG tablet  2. Diarrhea 787.91 R19.7 levofloxacin (LEVAQUIN) 500 MG tablet  3. Sore throat 462 J02.9 levofloxacin (LEVAQUIN) 500 MG tablet     Signed, Elvina Sidle, MD    Signed, Elvina Sidle, MD 06/22/2015 1:28 PM

## 2015-06-24 NOTE — Progress Notes (Signed)
Pharmacy contacted [facility]

## 2015-09-06 ENCOUNTER — Ambulatory Visit (INDEPENDENT_AMBULATORY_CARE_PROVIDER_SITE_OTHER): Payer: BLUE CROSS/BLUE SHIELD | Admitting: Physician Assistant

## 2015-09-06 VITALS — BP 118/72 | HR 97 | Temp 97.9°F | Resp 16 | Ht 58.5 in | Wt 114.0 lb

## 2015-09-06 DIAGNOSIS — J029 Acute pharyngitis, unspecified: Secondary | ICD-10-CM

## 2015-09-06 LAB — CBC
HCT: 42.7 % (ref 36.0–46.0)
HEMOGLOBIN: 14.6 g/dL (ref 12.0–15.0)
MCH: 28.6 pg (ref 26.0–34.0)
MCHC: 34.2 g/dL (ref 30.0–36.0)
MCV: 83.7 fL (ref 78.0–100.0)
MPV: 9.9 fL (ref 8.6–12.4)
Platelets: 223 10*3/uL (ref 150–400)
RBC: 5.1 MIL/uL (ref 3.87–5.11)
RDW: 14.4 % (ref 11.5–15.5)
WBC: 6.9 10*3/uL (ref 4.0–10.5)

## 2015-09-06 LAB — POCT RAPID STREP A (OFFICE): Rapid Strep A Screen: NEGATIVE

## 2015-09-06 MED ORDER — FLUTICASONE PROPIONATE 50 MCG/ACT NA SUSP: 2.0000 | Freq: Every day | NASAL | Status: DC

## 2015-09-06 MED ORDER — CETIRIZINE HCL 10 MG PO TABS: 10.0000 mg | ORAL_TABLET | Freq: Every day | ORAL | Status: DC

## 2015-09-06 NOTE — Progress Notes (Signed)
[facility] [address] [place] [address] [phone]  Date:  09/06/2015   Name:  Leah Monroe   DOB:  [birth year]   MRN:  [id number]  PCP:  No PCP Per Patient    Chief Complaint: Sore Throat   History of Present Illness:  This is a 34 y.o. female with PMH  who is presenting with sore throat x 3 months. She was seen here 06/22/15 with diarrhea, sore throat and cough. She saw Dr. Milus Glazier who rx'd levaquin. Cough and diarrhea went away. Sore throat stayed the same. The sore throat is worse in the mornings but hurts all day. Feels like something is stuck in the back of her throat. She denies other uri sx. She has not tried anything for her symptoms. She states she does not have a history of asthma or allergies. She does endorse having year round intermittent itchy eyes. No sneezing. She reports three years ago she had a lingering sore throat and was eventually seen by ENT and rx'd "pills for infection". She has no sick contacts.  Cough: no SOB/wheezing: no Nasal congestion: no Otalgia: no Sore throat: yes Fever/chills: no Tobacco use: no  Review of Systems:  Review of Systems See HPI  There are no active problems to display for this patient.   Prior to Admission medications   Not on File    No Known Allergies  History reviewed. No pertinent past surgical history.  Social History  Substance Use Topics  . Smoking status: Never Smoker   . Smokeless tobacco: None  . Alcohol Use: No    History reviewed. No pertinent family history.  Medication list has been reviewed and updated.  Physical Examination:  Physical Exam  Constitutional: She is oriented to person, place, and time. She appears well-developed and well-nourished. No distress.  HENT:  Head: Normocephalic and atraumatic.  Right Ear: Hearing, external ear and ear canal normal. Tympanic membrane is retracted.  Left Ear: Hearing, external ear and ear canal normal. Tympanic membrane is  retracted.  Nose: Nose normal.  Mouth/Throat: Uvula is midline and mucous membranes are normal. Posterior oropharyngeal erythema present. No oropharyngeal exudate or posterior oropharyngeal edema.  Eyes: Conjunctivae and lids are normal. Right eye exhibits no discharge. Left eye exhibits no discharge. No scleral icterus.  Neck: Trachea normal.  Cardiovascular: Normal rate, regular rhythm, normal heart sounds and normal pulses.   No murmur heard. Pulmonary/Chest: Effort normal and breath sounds normal. No respiratory distress. She has no wheezes. She has no rhonchi. She has no rales.  Musculoskeletal: Normal range of motion.  Lymphadenopathy:       Head (right side): No submental, no submandibular and no tonsillar adenopathy present.       Head (left side): No submental, no submandibular and no tonsillar adenopathy present.    She has no cervical adenopathy.  Neurological: She is alert and oriented to person, place, and time.  Skin: Skin is warm, dry and intact. No lesion and no rash noted.  Psychiatric: She has a normal mood and affect. Her speech is normal and behavior is normal. Thought content normal.   BP 118/72 mmHg  Pulse 97  Temp(Src) 97.9 F (36.6 C) (Oral)  Resp 16  Ht 4' 10.5" (1.486 m)  Wt 114 lb (51.71 kg)  BMI 23.42 kg/m2  SpO2 99%  LMP 09/02/2015  Results for orders placed or performed in visit on 09/06/15  POCT rapid strep A  Result Value Ref Range  Rapid Strep A Screen Negative Negative   Assessment and Plan:  1. Sore throat Labs below pending although I suspect allergic contribution. She will start daily flonase and zyrtec. If not getting better in 2 weeks on this regimen, she will let me know and I will refer to ENT. - CBC - Gonococcus culture - POCT rapid strep A - Epstein-Barr virus VCA antibody panel - fluticasone (FLONASE) 50 MCG/ACT nasal spray; Place 2 sprays into both nostrils daily.  Dispense: 16 g; Refill: 12 - cetirizine (ZYRTEC ALLERGY) 10 MG  tablet; Take 1 tablet (10 mg total) by mouth daily.  Dispense: 30 tablet; Refill: 11 - Culture, Group A Strep   Roswell MinersNicole V. Dyke BrackettBush, PA-C, MHS [facility]  09/06/2015

## 2015-09-06 NOTE — Patient Instructions (Signed)
Use flonase daily. Take zyrtec daily before bed. I will call you with your lab results. Return if not getting better in 2 weeks.

## 2015-09-07 LAB — CULTURE, GROUP A STREP: Organism ID, Bacteria: NORMAL

## 2015-09-08 LAB — GONOCOCCUS CULTURE: ORGANISM ID, BACTERIA: NO GROWTH

## 2015-09-09 LAB — EPSTEIN-BARR VIRUS VCA ANTIBODY PANEL
EBV NA IgG: 412 U/mL — ABNORMAL HIGH (ref <18.0)
EBV VCA IgG: 142 U/mL — ABNORMAL HIGH (ref <18.0)

## 2015-10-31 ENCOUNTER — Ambulatory Visit (INDEPENDENT_AMBULATORY_CARE_PROVIDER_SITE_OTHER): Payer: BLUE CROSS/BLUE SHIELD | Admitting: Women's Health

## 2015-10-31 ENCOUNTER — Encounter: Payer: Self-pay | Admitting: Women's Health

## 2015-10-31 VITALS — BP 110/80 | Ht <= 58 in | Wt 120.0 lb

## 2015-10-31 DIAGNOSIS — L709 Acne, unspecified: Secondary | ICD-10-CM | POA: Diagnosis not present

## 2015-10-31 DIAGNOSIS — Z833 Family history of diabetes mellitus: Secondary | ICD-10-CM

## 2015-10-31 DIAGNOSIS — Z01419 Encounter for gynecological examination (general) (routine) without abnormal findings: Secondary | ICD-10-CM

## 2015-10-31 LAB — CBC WITH DIFFERENTIAL/PLATELET
BASOS ABS: 0 10*3/uL (ref 0.0–0.1)
BASOS PCT: 0 % (ref 0–1)
Eosinophils Absolute: 0.4 10*3/uL (ref 0.0–0.7)
Eosinophils Relative: 5 % (ref 0–5)
HEMATOCRIT: 43.9 % (ref 36.0–46.0)
HEMOGLOBIN: 14.5 g/dL (ref 12.0–15.0)
LYMPHS PCT: 22 % (ref 12–46)
Lymphs Abs: 1.6 10*3/uL (ref 0.7–4.0)
MCH: 28.3 pg (ref 26.0–34.0)
MCHC: 33 g/dL (ref 30.0–36.0)
MCV: 85.6 fL (ref 78.0–100.0)
MONO ABS: 0.5 10*3/uL (ref 0.1–1.0)
MPV: 10.6 fL (ref 8.6–12.4)
Monocytes Relative: 7 % (ref 3–12)
NEUTROS ABS: 4.9 10*3/uL (ref 1.7–7.7)
NEUTROS PCT: 66 % (ref 43–77)
Platelets: 253 10*3/uL (ref 150–400)
RBC: 5.13 MIL/uL — AB (ref 3.87–5.11)
RDW: 14.1 % (ref 11.5–15.5)
WBC: 7.4 10*3/uL (ref 4.0–10.5)

## 2015-10-31 LAB — GLUCOSE, RANDOM: GLUCOSE: 72 mg/dL (ref 65–99)

## 2015-10-31 NOTE — Patient Instructions (Signed)
Health Maintenance, Female Adopting a healthy lifestyle and getting preventive care can go a long way to promote health and wellness. Talk with your health care provider about what schedule of regular examinations is right for you. This is a good chance for you to check in with your provider about disease prevention and staying healthy. In between checkups, there are plenty of things you can do on your own. Experts have done a lot of research about which lifestyle changes and preventive measures are most likely to keep you healthy. Ask your health care provider for more information. WEIGHT AND DIET  Eat a healthy diet  Be sure to include plenty of vegetables, fruits, low-fat dairy products, and lean protein.  Do not eat a lot of foods high in solid fats, added sugars, or salt.  Get regular exercise. This is one of the most important things you can do for your health.  Most adults should exercise for at least 150 minutes each week. The exercise should increase your heart rate and make you sweat (moderate-intensity exercise).  Most adults should also do strengthening exercises at least twice a week. This is in addition to the moderate-intensity exercise.  Maintain a healthy weight  Body mass index (BMI) is a measurement that can be used to identify possible weight problems. It estimates body fat based on height and weight. Your health care provider can help determine your BMI and help you achieve or maintain a healthy weight.  For females 20 years of age and older:   A BMI below 18.5 is considered underweight.  A BMI of 18.5 to 24.9 is normal.  A BMI of 25 to 29.9 is considered overweight.  A BMI of 30 and above is considered obese.  Watch levels of cholesterol and blood lipids  You should start having your blood tested for lipids and cholesterol at 37 years of age, then have this test every 5 years.  You may need to have your cholesterol levels checked more often if:  Your lipid  or cholesterol levels are high.  You are older than 37 years of age.  You are at high risk for heart disease.  CANCER SCREENING   Lung Cancer  Lung cancer screening is recommended for adults 55-80 years old who are at high risk for lung cancer because of a history of smoking.  A yearly low-dose CT scan of the lungs is recommended for people who:  Currently smoke.  Have quit within the past 15 years.  Have at least a 30-pack-year history of smoking. A pack year is smoking an average of one pack of cigarettes a day for 1 year.  Yearly screening should continue until it has been 15 years since you quit.  Yearly screening should stop if you develop a health problem that would prevent you from having lung cancer treatment.  Breast Cancer  Practice breast self-awareness. This means understanding how your breasts normally appear and feel.  It also means doing regular breast self-exams. Let your health care provider know about any changes, no matter how small.  If you are in your 20s or 30s, you should have a clinical breast exam (CBE) by a health care provider every 1-3 years as part of a regular health exam.  If you are 40 or older, have a CBE every year. Also consider having a breast X-ray (mammogram) every year.  If you have a family history of breast cancer, talk to your health care provider about genetic screening.  If you   are at high risk for breast cancer, talk to your health care provider about having an MRI and a mammogram every year.  Breast cancer gene (BRCA) assessment is recommended for women who have family members with BRCA-related cancers. BRCA-related cancers include:  Breast.  Ovarian.  Tubal.  Peritoneal cancers.  Results of the assessment will determine the need for genetic counseling and BRCA1 and BRCA2 testing. Cervical Cancer Your health care provider may recommend that you be screened regularly for cancer of the pelvic organs (ovaries, uterus, and  vagina). This screening involves a pelvic examination, including checking for microscopic changes to the surface of your cervix (Pap test). You may be encouraged to have this screening done every 3 years, beginning at age 21.  For women ages 30-65, health care providers may recommend pelvic exams and Pap testing every 3 years, or they may recommend the Pap and pelvic exam, combined with testing for human papilloma virus (HPV), every 5 years. Some types of HPV increase your risk of cervical cancer. Testing for HPV may also be done on women of any age with unclear Pap test results.  Other health care providers may not recommend any screening for nonpregnant women who are considered low risk for pelvic cancer and who do not have symptoms. Ask your health care provider if a screening pelvic exam is right for you.  If you have had past treatment for cervical cancer or a condition that could lead to cancer, you need Pap tests and screening for cancer for at least 20 years after your treatment. If Pap tests have been discontinued, your risk factors (such as having a new sexual partner) need to be reassessed to determine if screening should resume. Some women have medical problems that increase the chance of getting cervical cancer. In these cases, your health care provider may recommend more frequent screening and Pap tests. Colorectal Cancer  This type of cancer can be detected and often prevented.  Routine colorectal cancer screening usually begins at 37 years of age and continues through 37 years of age.  Your health care provider may recommend screening at an earlier age if you have risk factors for colon cancer.  Your health care provider may also recommend using home test kits to check for hidden blood in the stool.  A small camera at the end of a tube can be used to examine your colon directly (sigmoidoscopy or colonoscopy). This is done to check for the earliest forms of colorectal  cancer.  Routine screening usually begins at age 50.  Direct examination of the colon should be repeated every 5-10 years through 37 years of age. However, you may need to be screened more often if early forms of precancerous polyps or small growths are found. Skin Cancer  Check your skin from head to toe regularly.  Tell your health care provider about any new moles or changes in moles, especially if there is a change in a mole's shape or color.  Also tell your health care provider if you have a mole that is larger than the size of a pencil eraser.  Always use sunscreen. Apply sunscreen liberally and repeatedly throughout the day.  Protect yourself by wearing long sleeves, pants, a wide-brimmed hat, and sunglasses whenever you are outside. HEART DISEASE, DIABETES, AND HIGH BLOOD PRESSURE   High blood pressure causes heart disease and increases the risk of stroke. High blood pressure is more likely to develop in:  People who have blood pressure in the high end   of the normal range (130-139/85-89 mm Hg).  People who are overweight or obese.  People who are African American.  If you are 38-23 years of age, have your blood pressure checked every 3-5 years. If you are 61 years of age or older, have your blood pressure checked every year. You should have your blood pressure measured twice--once when you are at a hospital or clinic, and once when you are not at a hospital or clinic. Record the average of the two measurements. To check your blood pressure when you are not at a hospital or clinic, you can use:  An automated blood pressure machine at a pharmacy.  A home blood pressure monitor.  If you are between 45 years and 39 years old, ask your health care provider if you should take aspirin to prevent strokes.  Have regular diabetes screenings. This involves taking a blood sample to check your fasting blood sugar level.  If you are at a normal weight and have a low risk for diabetes,  have this test once every three years after 37 years of age.  If you are overweight and have a high risk for diabetes, consider being tested at a younger age or more often. PREVENTING INFECTION  Hepatitis B  If you have a higher risk for hepatitis B, you should be screened for this virus. You are considered at high risk for hepatitis B if:  You were born in a country where hepatitis B is common. Ask your health care provider which countries are considered high risk.  Your parents were born in a high-risk country, and you have not been immunized against hepatitis B (hepatitis B vaccine).  You have HIV or AIDS.  You use needles to inject street drugs.  You live with someone who has hepatitis B.  You have had sex with someone who has hepatitis B.  You get hemodialysis treatment.  You take certain medicines for conditions, including cancer, organ transplantation, and autoimmune conditions. Hepatitis C  Blood testing is recommended for:  Everyone born from [birth year] through 1967.  Anyone with known risk factors for hepatitis C. Sexually transmitted infections (STIs)  You should be screened for sexually transmitted infections (STIs) including gonorrhea and chlamydia if:  You are sexually active and are younger than 37 years of age.  You are older than 37 years of age and your health care provider tells you that you are at risk for this type of infection.  Your sexual activity has changed since you were last screened and you are at an increased risk for chlamydia or gonorrhea. Ask your health care provider if you are at risk.  If you do not have HIV, but are at risk, it may be recommended that you take a prescription medicine daily to prevent HIV infection. This is called pre-exposure prophylaxis (PrEP). You are considered at risk if:  You are sexually active and do not regularly use condoms or know the HIV status of your partner(s).  You take drugs by injection.  You are sexually  active with a partner who has HIV. Talk with your health care provider about whether you are at high risk of being infected with HIV. If you choose to begin PrEP, you should first be tested for HIV. You should then be tested every 3 months for as long as you are taking PrEP.  PREGNANCY   If you are premenopausal and you may become pregnant, ask your health care provider about preconception counseling.  If you may  become pregnant, take 400 to 800 micrograms (mcg) of folic acid every day.  If you want to prevent pregnancy, talk to your health care provider about birth control (contraception). OSTEOPOROSIS AND MENOPAUSE   Osteoporosis is a disease in which the bones lose minerals and strength with aging. This can result in serious bone fractures. Your risk for osteoporosis can be identified using a bone density scan.  If you are 61 years of age or older, or if you are at risk for osteoporosis and fractures, ask your health care provider if you should be screened.  Ask your health care provider whether you should take a calcium or vitamin D supplement to lower your risk for osteoporosis.  Menopause may have certain physical symptoms and risks.  Hormone replacement therapy may reduce some of these symptoms and risks. Talk to your health care provider about whether hormone replacement therapy is right for you.  HOME CARE INSTRUCTIONS   Schedule regular health, dental, and eye exams.  Stay current with your immunizations.   Do not use any tobacco products including cigarettes, chewing tobacco, or electronic cigarettes.  If you are pregnant, do not drink alcohol.  If you are breastfeeding, limit how much and how often you drink alcohol.  Limit alcohol intake to no more than 1 drink per day for nonpregnant women. One drink equals 12 ounces of beer, 5 ounces of wine, or 1 ounces of hard liquor.  Do not use street drugs.  Do not share needles.  Ask your health care provider for help if  you need support or information about quitting drugs.  Tell your health care provider if you often feel depressed.  Tell your health care provider if you have ever been abused or do not feel safe at home.   This information is not intended to replace advice given to you by your health care provider. Make sure you discuss any questions you have with your health care provider.   Document Released: 04/13/2011 Document Revised: 10/19/2014 Document Reviewed: 08/30/2013 Elsevier Interactive Patient Education [employer].

## 2015-10-31 NOTE — Progress Notes (Signed)
Leah Monroe Feb 18, 1981 [id number]    History:    Presents for annual exam.  [employer] interpreter os phone. Monthly 3 day light cycle using withdrawal for contraception. Declines other contraception is contemplating a second baby in the next year or 2. Has a 37-year-old son. Denies any problems with discharge, abdominal pain, urinary symptoms. Denies any family history of cancer or breast cancer. Acne declines dermatology consult or oral contraceptives.  Past medical history, past surgical history, family history and social history were all reviewed and documented in the EPIC chart. Works in [employer], active job.  ROS:  A ROS was performed and pertinent positives and negatives are included.  Exam:  Filed Vitals:   10/31/15 1052  BP: 110/80    General appearance:  Normal Thyroid:  Symmetrical, normal in size, without palpable masses or nodularity. Respiratory  Auscultation:  Clear without wheezing or rhonchi Cardiovascular  Auscultation:  Regular rate, without rubs, murmurs or gallops  Edema/varicosities:  Not grossly evident Abdominal  Soft,nontender, without masses, guarding or rebound.  Liver/spleen:  No organomegaly noted  Hernia:  None appreciated  Skin  Inspection:  Grossly normal   Breasts: Examined lying and sitting.     Right: Without masses, retractions, discharge or axillary adenopathy.     Left: Without masses, retractions, discharge or axillary adenopathy. Gentitourinary   Inguinal/mons:  Normal without inguinal adenopathy  External genitalia:  Normal  BUS/Urethra/Skene's glands:  Normal  Vagina:  Normal  Cervix:  Normal  Uterus: normal in size, shape and contour.  Midline and mobile  Adnexa/parametria:     Rt: Without masses or tenderness.   Lt: Without masses or tenderness.  Anus and perineum: Normal  Digital rectal exam: Normal sphincter tone without palpated masses or tenderness  Assessment/Plan:  35 y.o. MAF G1 P1 for annual exam with no  complaints.  Monthly 3 day light cycle/withdrawal  Plan: Contraception options reviewed and declined. SBE's, start annual screening mammogram at 40, calcium rich diet, prenatal vitamin daily encouraged. Safe pregnancy behaviors reviewed. CBC, glucose, UA. Pap normal 2016, new screening guidelines reviewed.  Huel Cote [facility], 11:34 AM 10/31/2015

## 2015-11-01 LAB — URINALYSIS W MICROSCOPIC + REFLEX CULTURE
BILIRUBIN URINE: NEGATIVE
Casts: NONE SEEN [LPF]
Crystals: NONE SEEN [HPF]
GLUCOSE, UA: NEGATIVE
HGB URINE DIPSTICK: NEGATIVE
KETONES UR: NEGATIVE
Nitrite: NEGATIVE
PROTEIN: NEGATIVE
RBC / HPF: NONE SEEN RBC/HPF (ref <=2)
Specific Gravity, Urine: 1.021 (ref 1.001–1.035)
pH: 5.5 (ref 5.0–8.0)

## 2015-11-02 LAB — URINE CULTURE

## 2015-11-05 ENCOUNTER — Other Ambulatory Visit: Payer: Self-pay | Admitting: Women's Health

## 2015-11-05 MED ORDER — FLUCONAZOLE 150 MG PO TABS: 150.0000 mg | ORAL_TABLET | Freq: Once | ORAL | Status: DC

## 2015-11-17 ENCOUNTER — Ambulatory Visit (INDEPENDENT_AMBULATORY_CARE_PROVIDER_SITE_OTHER): Payer: BLUE CROSS/BLUE SHIELD | Admitting: Physician Assistant

## 2015-11-17 VITALS — BP 132/80 | HR 92 | Temp 98.3°F | Resp 16 | Ht 58.75 in | Wt 119.0 lb

## 2015-11-17 DIAGNOSIS — J069 Acute upper respiratory infection, unspecified: Secondary | ICD-10-CM | POA: Diagnosis not present

## 2015-11-17 DIAGNOSIS — J309 Allergic rhinitis, unspecified: Secondary | ICD-10-CM | POA: Diagnosis not present

## 2015-11-17 DIAGNOSIS — J029 Acute pharyngitis, unspecified: Secondary | ICD-10-CM

## 2015-11-17 LAB — POCT RAPID STREP A (OFFICE): Rapid Strep A Screen: NEGATIVE

## 2015-11-17 MED ORDER — MAGIC MOUTHWASH W/LIDOCAINE: 10.0000 mL | ORAL | Status: DC | PRN

## 2015-11-17 MED ORDER — IPRATROPIUM BROMIDE 0.03 % NA SOLN: 2.0000 | Freq: Two times a day (BID) | NASAL | Status: DC

## 2015-11-17 MED ORDER — HYDROCOD POLST-CPM POLST ER 10-8 MG/5ML PO SUER: 5.0000 mL | Freq: Two times a day (BID) | ORAL | Status: DC | PRN

## 2015-11-17 NOTE — Progress Notes (Signed)
[facility] [address] [place] [address] [phone]  Date:  11/17/2015   Name:  Leah Monroe   DOB:  [birth year]   MRN:  [id number]  PCP:  No PCP Per Patient    Chief Complaint: Sore Throat; Cough; and Nasal Congestion   History of Present Illness:  This is a 35 y.o. female who is presenting with 3 days of sore throat, cough and nasal congestion. Cough is dry. No sob or wheezing. No otalgia. No fever or chills.   Aggravating/alleviating factors: Taking mucinex and not helping History of asthma: no History of env allergies: yes Tobacco use: no  Of note, pt was seen here 09/06/15 with persistent sore throat. Comprehensive work up negative. Determined sore throat to be caused by allergies. Gave flonase and zyrtec. She states sore throat did get better on this. She has not continue the zyrtec/flonase.  Review of Systems:  Review of Systems See HPI  Patient Active Problem List   Diagnosis Date Noted  . Acne 10/31/2015    Prior to Admission medications   Medication Sig Start Date End Date Taking? Authorizing Provider  fluconazole (DIFLUCAN) 150 MG tablet Take 1 tablet (150 mg total) by mouth once. Patient not taking: Reported on 11/17/2015 11/05/15   Harrington Challenger, NP    No Known Allergies  History reviewed. No pertinent past surgical history.  Social History  Substance Use Topics  . Smoking status: Never Smoker   . Smokeless tobacco: None  . Alcohol Use: No    No family history on file.  Medication list has been reviewed and updated.  Physical Examination:  Physical Exam  Constitutional: She is oriented to person, place, and time. She appears well-developed and well-nourished. No distress.  HENT:  Head: Normocephalic and atraumatic.  Right Ear: Hearing, tympanic membrane, external ear and ear canal normal.  Left Ear: Hearing, tympanic membrane, external ear and ear canal normal.  Nose: Nose normal.  Mouth/Throat: Uvula is midline and  mucous membranes are normal. Posterior oropharyngeal erythema present. No oropharyngeal exudate or posterior oropharyngeal edema.  Eyes: Conjunctivae and lids are normal. Right eye exhibits no discharge. Left eye exhibits no discharge. No scleral icterus.  Cardiovascular: Normal rate, regular rhythm, normal heart sounds and normal pulses.   No murmur heard. Pulmonary/Chest: Effort normal and breath sounds normal. No respiratory distress. She has no wheezes. She has no rhonchi. She has no rales.  Musculoskeletal: Normal range of motion.  Lymphadenopathy:       Head (right side): No submental, no submandibular and no tonsillar adenopathy present.       Head (left side): No submental, no submandibular and no tonsillar adenopathy present.    She has no cervical adenopathy.  Neurological: She is alert and oriented to person, place, and time.  Skin: Skin is warm, dry and intact. No lesion and no rash noted.  Psychiatric: She has a normal mood and affect. Her speech is normal and behavior is normal. Thought content normal.    BP 132/80 mmHg  Pulse 92  Temp(Src) 98.3 F (36.8 C) (Oral)  Resp 16  Ht 4' 10.75" (1.492 m)  Wt 119 lb (53.978 kg)  BMI 24.25 kg/m2  SpO2 99%  LMP 10/16/2015  Results for orders placed or performed in visit on 11/17/15  POCT rapid strep A  Result Value Ref Range   Rapid Strep A Screen Negative Negative    Assessment and Plan:  1. Viral URI 2. Sore throat 3.  Allergic rhinitis Suspect viral etiology with allergic contribution. Focus is on supportive care, see meds prescribed below. Advised she may need to start back on zyrtec and flonase. Return if symptoms not getting better in 2-3 weeks. - magic mouthwash w/lidocaine SOLN; Take 10 mLs by mouth every 2 (two) hours as needed for mouth pain.  Dispense: 360 mL; Refill: 0 - chlorpheniramine-HYDROcodone (TUSSIONEX PENNKINETIC ER) 10-8 MG/5ML SUER; Take 5 mLs by mouth every 12 (twelve) hours as needed for cough.   Dispense: 100 mL; Refill: 0 - ipratropium (ATROVENT) 0.03 % nasal spray; Place 2 sprays into both nostrils 2 (two) times daily.  Dispense: 30 mL; Refill: 0 - POCT rapid strep A - Culture, Group A Strep  [place] [employer]. Dyke Brackett, MHS [facility]  11/17/2015

## 2015-11-17 NOTE — Patient Instructions (Signed)
Drink plenty of water (64 oz/day) and get plenty of rest. If you have been prescribed a cough syrup, do not drive or operate heavy machinery while using this medication. If you have been prescribed a nasal spray, use twice a day. Gargle mouthwash every 2 hours as needed for sore throat. Do not swallow. If your symptoms are not improving in 1 week, return to clinic.  You may need to start back on zyrtec and flonase nasal spray for allergies.Marland KitchenMarland KitchenMarland Kitchen

## 2015-11-19 LAB — CULTURE, GROUP A STREP: Organism ID, Bacteria: NORMAL

## 2016-11-03 ENCOUNTER — Encounter: Payer: Self-pay | Admitting: Women's Health

## 2016-11-03 ENCOUNTER — Ambulatory Visit (INDEPENDENT_AMBULATORY_CARE_PROVIDER_SITE_OTHER): Payer: BLUE CROSS/BLUE SHIELD | Admitting: Women's Health

## 2016-11-03 VITALS — BP 138/84 | Ht <= 58 in | Wt 119.0 lb

## 2016-11-03 DIAGNOSIS — L700 Acne vulgaris: Secondary | ICD-10-CM

## 2016-11-03 DIAGNOSIS — Z13220 Encounter for screening for lipoid disorders: Secondary | ICD-10-CM | POA: Diagnosis not present

## 2016-11-03 DIAGNOSIS — Z01419 Encounter for gynecological examination (general) (routine) without abnormal findings: Secondary | ICD-10-CM

## 2016-11-03 LAB — CBC WITH DIFFERENTIAL/PLATELET
BASOS ABS: 0 {cells}/uL (ref 0–200)
Basophils Relative: 0 %
EOS PCT: 3 %
Eosinophils Absolute: 207 cells/uL (ref 15–500)
HCT: 44.2 % (ref 35.0–45.0)
HEMOGLOBIN: 14.4 g/dL (ref 11.7–15.5)
LYMPHS ABS: 1587 {cells}/uL (ref 850–3900)
Lymphocytes Relative: 23 %
MCH: 28.6 pg (ref 27.0–33.0)
MCHC: 32.6 g/dL (ref 32.0–36.0)
MCV: 87.7 fL (ref 80.0–100.0)
MPV: 10.4 fL (ref 7.5–12.5)
Monocytes Absolute: 552 cells/uL (ref 200–950)
Monocytes Relative: 8 %
NEUTROS ABS: 4554 {cells}/uL (ref 1500–7800)
Neutrophils Relative %: 66 %
Platelets: 202 10*3/uL (ref 140–400)
RBC: 5.04 MIL/uL (ref 3.80–5.10)
RDW: 13.8 % (ref 11.0–15.0)
WBC: 6.9 10*3/uL (ref 3.8–10.8)

## 2016-11-03 LAB — LIPID PANEL
CHOL/HDL RATIO: 3.1 ratio (ref <5.0)
CHOLESTEROL: 146 mg/dL (ref <200)
HDL: 47 mg/dL — AB (ref >50)
LDL Cholesterol: 79 mg/dL (ref <100)
TRIGLYCERIDES: 102 mg/dL (ref <150)
VLDL: 20 mg/dL (ref <30)

## 2016-11-03 LAB — GLUCOSE, RANDOM: GLUCOSE: 78 mg/dL (ref 65–99)

## 2016-11-03 MED ORDER — FLUCONAZOLE 150 MG PO TABS: 150.0000 mg | ORAL_TABLET | Freq: Once | ORAL | 0 refills | Status: AC

## 2016-11-03 MED ORDER — MINOCYCLINE HCL 100 MG PO CAPS: 100.0000 mg | ORAL_CAPSULE | Freq: Two times a day (BID) | ORAL | 0 refills | Status: DC

## 2016-11-03 NOTE — Progress Notes (Signed)
Rosan Bernet 05/07/1981 [id number]    History:    Presents for annual exam.  [place]  interpreter present. Monthly 3 day cycle withdrawal. Prefers to have a second baby but husband is 2261 and does not want a second child has a 38-year-old daughter. Normal Pap history. Blood pressure initially elevated with second reading 138/84.  Past medical history, past surgical history, family history and social history were all reviewed and documented in the EPIC chart. Works in a factory. Denies family history of breast cancer, diabetes or hypertension .  ROS:  A ROS was performed and pertinent positives and negatives are included.  Exam:  Vitals:   11/03/16 1052 11/03/16 1117  BP: (!) 144/92 138/84  Weight: 119 lb (54 kg)   Height: 4\' 10"  (1.473 m)    Body mass index is 24.87 kg/m.   General appearance:  Normal Thyroid:  Symmetrical, normal in size, without palpable masses or nodularity. Respiratory  Auscultation:  Clear without wheezing or rhonchi Cardiovascular  Auscultation:  Regular rate, without rubs, murmurs or gallops  Edema/varicosities:  Not grossly evident Abdominal  Soft,nontender, without masses, guarding or rebound.  Liver/spleen:  No organomegaly noted  Hernia:  None appreciated  Skin  Inspection:  Grossly normal   Breasts: Examined lying and sitting.     Right: Without masses, retractions, discharge or axillary adenopathy.     Left: Without masses, retractions, discharge or axillary adenopathy. Gentitourinary   Inguinal/mons:  Normal without inguinal adenopathy  External genitalia:  Normal  BUS/Urethra/Skene's glands:  Normal  Vagina:  Normal  Cervix:  Normal  Uterus:   normal in size, shape and contour.  Midline and mobile  Adnexa/parametria:     Rt: Without masses or tenderness.   Lt: Without masses or tenderness.  Anus and perineum: Normal  Digital rectal exam: Normal sphincter tone without palpated masses or tenderness  Assessment/Plan:  36 y.o. MAFf G1 P1 for  annual exam with no complaints other than acne.  Monthly 3 day cycle/withdrawal Acne Elevated blood pressure  Plan: Contraception options reviewed and declined. Encouraged referral to dermatologist, declines. Reviewed we could try minocycline 100 mg twice daily for 2 weeks currently on cycle. Reviewed harmful to babies with pregnancy cannot to long-term. Prescription given, and if decides wants to see dermatologist will call back. SBE's, annual screening mammogram at 40. DASH diet reviewed briefly, instructed to recheck blood pressure, if greater than 130/80 will need to see primary care for possible medication. CBC, CMP, lipid panel, UA, Pap normal 10/2014, new screening guidelines reviewed.  Harrington ChallengerYOUNG,NANCY J [facility] Subacute Care CenterWHNP, 1:46 PM 11/03/2016

## 2016-11-03 NOTE — Patient Instructions (Addendum)
Health Maintenance, Female Introduction Adopting a healthy lifestyle and getting preventive care can go a long way to promote health and wellness. Talk with your health care provider about what schedule of regular examinations is right for you. This is a good chance for you to check in with your provider about disease prevention and staying healthy. In between checkups, there are plenty of things you can do on your own. Experts have done a lot of research about which lifestyle changes and preventive measures are most likely to keep you healthy. Ask your health care provider for more information. Weight and diet Eat a healthy diet  Be sure to include plenty of vegetables, fruits, low-fat dairy products, and lean protein.  Do not eat a lot of foods high in solid fats, added sugars, or salt.  Get regular exercise. This is one of the most important things you can do for your health.  Most adults should exercise for at least 150 minutes each week. The exercise should increase your heart rate and make you sweat (moderate-intensity exercise).  Most adults should also do strengthening exercises at least twice a week. This is in addition to the moderate-intensity exercise. Maintain a healthy weight  Body mass index (BMI) is a measurement that can be used to identify possible weight problems. It estimates body fat based on height and weight. Your health care provider can help determine your BMI and help you achieve or maintain a healthy weight.  For females 63 years of age and older:  A BMI below 18.5 is considered underweight.  A BMI of 18.5 to 24.9 is normal.  A BMI of 25 to 29.9 is considered overweight.  A BMI of 30 and above is considered obese. Watch levels of cholesterol and blood lipids  You should start having your blood tested for lipids and cholesterol at 38 years of age, then have this test every 5 years.  You may need to have your cholesterol levels checked more often if:  Your  lipid or cholesterol levels are high.  You are older than 38 years of age.  You are at high risk for heart disease. Cancer screening Lung Cancer  Lung cancer screening is recommended for adults 56-22 years old who are at high risk for lung cancer because of a history of smoking.  A yearly low-dose CT scan of the lungs is recommended for people who:  Currently smoke.  Have quit within the past 15 years.  Have at least a 30-pack-year history of smoking. A pack year is smoking an average of one pack of cigarettes a day for 1 year.  Yearly screening should continue until it has been 15 years since you quit.  Yearly screening should stop if you develop a health problem that would prevent you from having lung cancer treatment. Breast Cancer  Practice breast self-awareness. This means understanding how your breasts normally appear and feel.  It also means doing regular breast self-exams. Let your health care provider know about any changes, no matter how small.  If you are in your 20s or 30s, you should have a clinical breast exam (CBE) by a health care provider every 1-3 years as part of a regular health exam.  If you are 35 or older, have a CBE every year. Also consider having a breast X-ray (mammogram) every year.  If you have a family history of breast cancer, talk to your health care provider about genetic screening.  If you are at high risk for breast cancer,  talk to your health care provider about having an MRI and a mammogram every year.  Breast cancer gene (BRCA) assessment is recommended for women who have family members with BRCA-related cancers. BRCA-related cancers include:  Breast.  Ovarian.  Tubal.  Peritoneal cancers.  Results of the assessment will determine the need for genetic counseling and BRCA1 and BRCA2 testing. Cervical Cancer  Your health care provider may recommend that you be screened regularly for cancer of the pelvic organs (ovaries, uterus, and  vagina). This screening involves a pelvic examination, including checking for microscopic changes to the surface of your cervix (Pap test). You may be encouraged to have this screening done every 3 years, beginning at age 21.  For women ages 30-65, health care providers may recommend pelvic exams and Pap testing every 3 years, or they may recommend the Pap and pelvic exam, combined with testing for human papilloma virus (HPV), every 5 years. Some types of HPV increase your risk of cervical cancer. Testing for HPV may also be done on women of any age with unclear Pap test results.  Other health care providers may not recommend any screening for nonpregnant women who are considered low risk for pelvic cancer and who do not have symptoms. Ask your health care provider if a screening pelvic exam is right for you.  If you have had past treatment for cervical cancer or a condition that could lead to cancer, you need Pap tests and screening for cancer for at least 20 years after your treatment. If Pap tests have been discontinued, your risk factors (such as having a new sexual partner) need to be reassessed to determine if screening should resume. Some women have medical problems that increase the chance of getting cervical cancer. In these cases, your health care provider may recommend more frequent screening and Pap tests. Colorectal Cancer  This type of cancer can be detected and often prevented.  Routine colorectal cancer screening usually begins at 38 years of age and continues through 38 years of age.  Your health care provider may recommend screening at an earlier age if you have risk factors for colon cancer.  Your health care provider may also recommend using home test kits to check for hidden blood in the stool.  A small camera at the end of a tube can be used to examine your colon directly (sigmoidoscopy or colonoscopy). This is done to check for the earliest forms of colorectal  cancer.  Routine screening usually begins at age 50.  Direct examination of the colon should be repeated every 5-10 years through 38 years of age. However, you may need to be screened more often if early forms of precancerous polyps or small growths are found. Skin Cancer  Check your skin from head to toe regularly.  Tell your health care provider about any new moles or changes in moles, especially if there is a change in a mole's shape or color.  Also tell your health care provider if you have a mole that is larger than the size of a pencil eraser.  Always use sunscreen. Apply sunscreen liberally and repeatedly throughout the day.  Protect yourself by wearing long sleeves, pants, a wide-brimmed hat, and sunglasses whenever you are outside. Heart disease, diabetes, and high blood pressure  High blood pressure causes heart disease and increases the risk of stroke. High blood pressure is more likely to develop in:  People who have blood pressure in the high end of the normal range (130-139/85-89 mm Hg).    People who are overweight or obese.  People who are African American.  If you are 18-39 years of age, have your blood pressure checked every 3-5 years. If you are 40 years of age or older, have your blood pressure checked every year. You should have your blood pressure measured twice-once when you are at a hospital or clinic, and once when you are not at a hospital or clinic. Record the average of the two measurements. To check your blood pressure when you are not at a hospital or clinic, you can use:  An automated blood pressure machine at a pharmacy.  A home blood pressure monitor.  If you are between 55 years and 79 years old, ask your health care provider if you should take aspirin to prevent strokes.  Have regular diabetes screenings. This involves taking a blood sample to check your fasting blood sugar level.  If you are at a normal weight and have a low risk for diabetes,  have this test once every three years after 38 years of age.  If you are overweight and have a high risk for diabetes, consider being tested at a younger age or more often. Preventing infection Hepatitis B  If you have a higher risk for hepatitis B, you should be screened for this virus. You are considered at high risk for hepatitis B if:  You were born in a country where hepatitis B is common. Ask your health care provider which countries are considered high risk.  Your parents were born in a high-risk country, and you have not been immunized against hepatitis B (hepatitis B vaccine).  You have HIV or AIDS.  You use needles to inject street drugs.  You live with someone who has hepatitis B.  You have had sex with someone who has hepatitis B.  You get hemodialysis treatment.  You take certain medicines for conditions, including cancer, organ transplantation, and autoimmune conditions. Hepatitis C  Blood testing is recommended for:  Everyone born from 1945 through 1965.  Anyone with known risk factors for hepatitis C. Sexually transmitted infections (STIs)  You should be screened for sexually transmitted infections (STIs) including gonorrhea and chlamydia if:  You are sexually active and are younger than 38 years of age.  You are older than 38 years of age and your health care provider tells you that you are at risk for this type of infection.  Your sexual activity has changed since you were last screened and you are at an increased risk for chlamydia or gonorrhea. Ask your health care provider if you are at risk.  If you do not have HIV, but are at risk, it may be recommended that you take a prescription medicine daily to prevent HIV infection. This is called pre-exposure prophylaxis (PrEP). You are considered at risk if:  You are sexually active and do not regularly use condoms or know the HIV status of your partner(s).  You take drugs by injection.  You are sexually  active with a partner who has HIV. Talk with your health care provider about whether you are at high risk of being infected with HIV. If you choose to begin PrEP, you should first be tested for HIV. You should then be tested every 3 months for as long as you are taking PrEP. Pregnancy  If you are premenopausal and you may become pregnant, ask your health care provider about preconception counseling.  If you may become pregnant, take 400 to 800 micrograms (mcg) of folic acid   every day.  If you want to prevent pregnancy, talk to your health care provider about birth control (contraception). Osteoporosis and menopause  Osteoporosis is a disease in which the bones lose minerals and strength with aging. This can result in serious bone fractures. Your risk for osteoporosis can be identified using a bone density scan.  If you are 30 years of age or older, or if you are at risk for osteoporosis and fractures, ask your health care provider if you should be screened.  Ask your health care provider whether you should take a calcium or vitamin D supplement to lower your risk for osteoporosis.  Menopause may have certain physical symptoms and risks.  Hormone replacement therapy may reduce some of these symptoms and risks. Talk to your health care provider about whether hormone replacement therapy is right for you. Follow these instructions at home:  Schedule regular health, dental, and eye exams.  Stay current with your immunizations.  Do not use any tobacco products including cigarettes, chewing tobacco, or electronic cigarettes.  If you are pregnant, do not drink alcohol.  If you are breastfeeding, limit how much and how often you drink alcohol.  Limit alcohol intake to no more than 1 drink per day for nonpregnant women. One drink equals 12 ounces of beer, 5 ounces of wine, or 1 ounces of hard liquor.  Do not use street drugs.  Do not share needles.  Ask your health care provider for  help if you need support or information about quitting drugs.  Tell your health care provider if you often feel depressed.  Tell your health care provider if you have ever been abused or do not feel safe at home. This information is not intended to replace advice given to you by your health care provider. Make sure you discuss any questions you have with your health care provider. Document Released: 04/13/2011 Document Revised: 03/05/2016 Document Reviewed: 07/02/2015  2017 Elsevier DASH Eating Plan DASH stands for "Dietary Approaches to Stop Hypertension." The DASH eating plan is a healthy eating plan that has been shown to reduce high blood pressure (hypertension). Additional health benefits may include reducing the risk of type 2 diabetes mellitus, heart disease, and stroke. The DASH eating plan may also help with weight loss. What do I need to know about the DASH eating plan? For the DASH eating plan, you will follow these general guidelines:  Choose foods with less than 150 milligrams of sodium per serving (as listed on the food label).  Use salt-free seasonings or herbs instead of table salt or sea salt.  Check with your health care provider or pharmacist before using salt substitutes.  Eat lower-sodium products. These are often labeled as "low-sodium" or "no salt added."  Eat fresh foods. Avoid eating a lot of canned foods.  Eat more vegetables, fruits, and low-fat dairy products.  Choose whole grains. Look for the word "whole" as the first word in the ingredient list.  Choose fish and skinless chicken or [place] more often than red meat. Limit fish, poultry, and meat to 6 oz (170 g) each day.  Limit sweets, desserts, sugars, and sugary drinks.  Choose heart-healthy fats.  Eat more home-cooked food and less restaurant, buffet, and fast food.  Limit fried foods.  Do not fry foods. Cook foods using methods such as baking, boiling, grilling, and broiling instead.  When  eating at a restaurant, ask that your food be prepared with less salt, or no salt if possible. What foods can  I eat? Seek help from a dietitian for individual calorie needs. Grains  Whole grain or whole wheat bread. Brown rice. Whole grain or whole wheat pasta. Quinoa, bulgur, and whole grain cereals. Low-sodium cereals. Corn or whole wheat flour tortillas. Whole grain cornbread. Whole grain crackers. Low-sodium crackers. Vegetables  Fresh or frozen vegetables (raw, steamed, roasted, or grilled). Low-sodium or reduced-sodium tomato and vegetable juices. Low-sodium or reduced-sodium tomato sauce and paste. Low-sodium or reduced-sodium canned vegetables. Fruits  All fresh, canned (in natural juice), or frozen fruits. Meat and Other Protein Products  Ground beef (85% or leaner), grass-fed beef, or beef trimmed of fat. Skinless chicken or [place]. Ground chicken or [place]. Pork trimmed of fat. All fish and seafood. Eggs. Dried beans, peas, or lentils. Unsalted nuts and seeds. Unsalted canned beans. Dairy  Low-fat dairy products, such as skim or 1% milk, 2% or reduced-fat cheeses, low-fat ricotta or cottage cheese, or plain low-fat yogurt. Low-sodium or reduced-sodium cheeses. Fats and Oils  Tub margarines without trans fats. Light or reduced-fat mayonnaise and salad dressings (reduced sodium). Avocado. Safflower, olive, or canola oils. Natural peanut or almond butter. Other  Unsalted popcorn and pretzels. The items listed above may not be a complete list of recommended foods or beverages. Contact your dietitian for more options.  What foods are not recommended? Grains  White bread. White pasta. White rice. Refined cornbread. Bagels and croissants. Crackers that contain trans fat. Vegetables  Creamed or fried vegetables. Vegetables in a cheese sauce. Regular canned vegetables. Regular canned tomato sauce and paste. Regular tomato and vegetable juices. Fruits  Canned fruit in light or heavy syrup.  Fruit juice. Meat and Other Protein Products  Fatty cuts of meat. Ribs, chicken wings, bacon, sausage, bologna, salami, chitterlings, fatback, hot dogs, bratwurst, and packaged luncheon meats. Salted nuts and seeds. Canned beans with salt. Dairy  Whole or 2% milk, cream, half-and-half, and cream cheese. Whole-fat or sweetened yogurt. Full-fat cheeses or blue cheese. Nondairy creamers and whipped toppings. Processed cheese, cheese spreads, or cheese curds. Condiments  Onion and garlic salt, seasoned salt, table salt, and sea salt. Canned and packaged gravies. Worcestershire sauce. Tartar sauce. Barbecue sauce. Teriyaki sauce. Soy sauce, including reduced sodium. Steak sauce. Fish sauce. Oyster sauce. Cocktail sauce. Horseradish. Ketchup and mustard. Meat flavorings and tenderizers. Bouillon cubes. Hot sauce. Tabasco sauce. Marinades. Taco seasonings. Relishes. Fats and Oils  Butter, stick margarine, lard, shortening, ghee, and bacon fat. Coconut, palm kernel, or palm oils. Regular salad dressings. Other  Pickles and olives. Salted popcorn and pretzels. The items listed above may not be a complete list of foods and beverages to avoid. Contact your dietitian for more information.  Where can I find more information? National Heart, Lung, and Blood Institute: travelstabloid.com This information is not intended to replace advice given to you by your health care provider. Make sure you discuss any questions you have with your health care provider. Document Released: 09/17/2011 Document Revised: 03/05/2016 Document Reviewed: 08/02/2013 Elsevier Interactive Patient Education  2017 [employer].

## 2016-11-04 LAB — URINALYSIS W MICROSCOPIC + REFLEX CULTURE
Bilirubin Urine: NEGATIVE
CASTS: NONE SEEN [LPF]
Crystals: NONE SEEN [HPF]
GLUCOSE, UA: NEGATIVE
NITRITE: NEGATIVE
Specific Gravity, Urine: 1.023 (ref 1.001–1.035)
YEAST: NONE SEEN [HPF]
pH: 5.5 (ref 5.0–8.0)

## 2016-11-05 LAB — URINE CULTURE

## 2016-11-16 ENCOUNTER — Ambulatory Visit (INDEPENDENT_AMBULATORY_CARE_PROVIDER_SITE_OTHER): Payer: BLUE CROSS/BLUE SHIELD | Admitting: Urgent Care

## 2016-11-16 VITALS — BP 132/82 | HR 77 | Temp 98.3°F | Resp 17 | Ht <= 58 in | Wt 117.0 lb

## 2016-11-16 DIAGNOSIS — R059 Cough, unspecified: Secondary | ICD-10-CM

## 2016-11-16 DIAGNOSIS — R05 Cough: Secondary | ICD-10-CM

## 2016-11-16 DIAGNOSIS — J029 Acute pharyngitis, unspecified: Secondary | ICD-10-CM

## 2016-11-16 MED ORDER — HYDROCOD POLST-CPM POLST ER 10-8 MG/5ML PO SUER: 5.0000 mL | Freq: Two times a day (BID) | ORAL | 0 refills | Status: DC | PRN

## 2016-11-16 MED ORDER — CETIRIZINE HCL 10 MG PO TABS: 10.0000 mg | ORAL_TABLET | Freq: Every day | ORAL | 11 refills | Status: DC

## 2016-11-16 MED ORDER — PSEUDOEPHEDRINE HCL ER 120 MG PO TB12: 120.0000 mg | ORAL_TABLET | Freq: Two times a day (BID) | ORAL | 3 refills | Status: DC

## 2016-11-16 MED ORDER — BENZONATATE 100 MG PO CAPS: 100.0000 mg | ORAL_CAPSULE | Freq: Three times a day (TID) | ORAL | 0 refills | Status: DC | PRN

## 2016-11-16 NOTE — Progress Notes (Signed)
  MRN: [id number] DOB: 10/04/1981  Subjective:   Leah Monroe is a 36 y.o. female presenting for chief complaint of Sore Throat (ONset 2-3 weeks) and Cough (dry onset 2-3 weeks)  Reports 3 week history persistent dry cough, scratchy and sore throat (worse at night when she lays down). Has tried Mucinex. Patient just finished 2 week course of minocycline for acne. Completed course 1 week ago. Denies fever, sinus pain, congestion, ear pain, chest pain, shob, wheezing, n/v, abdominal pain, rashes, body aches. Denies smoking cigarettes.  Leah Monroe currently has no medications in their medication list. Also has No Known Allergies.  Leah Monroe  has a past medical history of Acne. Also denies past surgeries.  Objective:   Vitals: BP 132/82 (BP Location: Right Arm, Patient Position: Sitting, Cuff Size: Normal)   Pulse 77   Temp 98.3 F (36.8 C) (Oral)   Resp 17   Ht 4\' 10"  (1.473 m)   Wt 117 lb (53.1 kg)   LMP 11/02/2016   SpO2 100%   BMI 24.45 kg/m   Physical Exam  Constitutional: She is oriented to person, place, and time. She appears well-developed and well-nourished.  HENT:  TM's intact bilaterally, no effusions or erythema. Nasal turbinates pink and moist, nasal passages patent. No sinus tenderness. Oropharynx with moderate post-nasal drainage, mucous membranes moist, dentition in good repair.  Eyes: Right eye exhibits no discharge. Left eye exhibits no discharge.  Neck: Normal range of motion. Neck supple.  Cardiovascular: Normal rate, regular rhythm and intact distal pulses.  Exam reveals no gallop and no friction rub.   No murmur heard. Pulmonary/Chest: No respiratory distress. She has no wheezes. She has no rales.  Lymphadenopathy:    She has no cervical adenopathy.  Neurological: She is alert and oriented to person, place, and time.  Skin: Skin is warm and dry.   Assessment and Plan :   1. Cough 2. Sore throat - Likely has residual symptoms from a viral URI. Will manage supportively,  cough suppression meds provided. Recheck in 1 week if no improvement.  Wallis BambergMario Dejuan Elman, PA-C [facility] [phone] 11/16/2016  9:08 AM

## 2016-11-16 NOTE — Patient Instructions (Signed)
Ho, Ng???i l??n (Cough, Adult) Ho l ph?n x? la?m sa?ch c? h?ng va? khi? qua?n cu?a quy? vi?. Ho gip ch?a lnh v b?o v? ph?i c?a quy? vi?. Thi?nh thoa?ng ho l bnh th??ng, nh?ng ho ke?m theo cc tri?u ch?ng khc ho?c ko di c th? l m?t d?u hi?u c?a m?t b?nh ly? c?n ?i?u tr?. Ho c th? ko da?i ch? 2-3 tu?n (c?p tnh), ho?c n c th? ko di h?n 8 tu?n (m?n tnh). NGUYN NHN Ho th??ng la? do:  Hi?t vo cc ch?t gy kch ?ng ph?i c?a quy? vi?.  Nhi?m vi rt ho?c vi khu?n ???ng h h?p.  D? ?ng.  Hen suy?n.  Cha?y di?ch mu?i sau.  Ht thu?c l.  Axi?t tra?o ng???c t?? d? dy ln th?c qu?n (tro ng??c d? dy th??c qua?n).  M?t s? lo?i thu?c nh?t ??nh.  Nh??ng v?n ?? ph?i ma?n tnh, k? c? COPD (ho?c hi?m khi, ung th? ph?i).  Cc ti?nh tra?ng b?nh ly? kha?c ch?ng h?n nh? suy tim. H??NG D?N CH?M SC T?I NH Ch  ??n nh?ng thay ??i v? tri?u ch?ng c?a qu v?. Ti?n hnh nh?ng hnh ??ng sau ?? gip gi?m kho? chi?u:  Ch? s? d?ng thu?c theo ch? d?n c?a chuyn gia ch?m sc s?c kh?e cu?a quy? vi?.  N?u qu v? ???c k thu?c khng sinh, hy dng thu?c theo ch? d?n c?a chuyn gia ch?m sc s?c kh?e. Khng d?ng u?ng thu?c khng sinh ngay c? khi qu v? b?t ??u c?m th?y ?? h?n.  Ni chuy?n v?i chuyn gia ch?m sc s?c kh?e tr??c khi quy? vi? du?ng m?t lo?i thu?c gia?m ho.  U?ng ?? n??c ?? gi? cho n??c ti?u trong ho?c c mu vng nh?t.  N?u khng kh kh, ha?y s? d?ng m?t ma?y h?i n???c ho??c ma?y t?o ?m trong phng ng? ho?c nha? quy? vi? ?? gip la?m l?ng di?ch ti?t ra.  Trnh b?t c? ?i?u g la?m quy? vi? ho t?i n?i lm vi?c hay ? nh.  N?u quy? vi? ho n??ng h?n vo ban ?m, hy th? ng? ?? v? tr n??a ng?i n??a n??m.  Trnh khi thu?c l. N?u qu v? ht thu?c, ha?y cai thu?c. N?u qu v? c?n gip ?? ?? cai thu?c, hy h?i chuyn gia ch?m sc s?c kh?e.  Trnh caffeine.  Trnh u?ng r??u.  Ngh? ng?i khi c?n. ?I KHM N?U:  Qu v? c cc tri?u ch?ng m?i.  Quy? vi? ho ra  m?.  Quy? vi? khng ??? ho sau 2-3 tu?n, ho?c quy? vi? ho n?ng h?n.  Qu v? khng th? ki?m sot c?n ho c?a mnh b?ng thu?c ho v quy? vi? b? m?t ng?.  Quy? vi? bi? ?au ma? tr?? nn n??ng h?n ho?c ?au m khng ki?m sot ????c b??ng thu?c gia?m ?au.  Qu v? b? s?t.  Qu v? b? s?t cn khng r nguyn nhn.  Qu v? ?? m? hi ban ?m. NGAY L?P T?C ?I KHM N?U:  Qu v? ho ra mu.  Qu v? b? kh th?.  Nh?p tim c?a quy? vi? r?t nhanh. Thng tin ny khng nh?m m?c ?ch thay th? cho l?i khuyn m chuyn gia ch?m sc s?c kh?e ni v?i qu v?. Hy b?o ??m qu v? ph?i th?o lu?n b?t k? v?n ?? g m qu v? c v?i chuyn gia ch?m sc s?c kh?e c?a qu v?. Document Released: 01/20/2016 Document Revised: 01/20/2016 Document Reviewed: 12/05/2014 Elsevier Interactive Patient Education  2017 Else[employer] Inc[employer]  

## 2017-02-24 ENCOUNTER — Encounter: Payer: Self-pay | Admitting: Gynecology

## 2017-07-06 ENCOUNTER — Ambulatory Visit (INDEPENDENT_AMBULATORY_CARE_PROVIDER_SITE_OTHER): Payer: BLUE CROSS/BLUE SHIELD | Admitting: Emergency Medicine

## 2017-07-06 ENCOUNTER — Encounter: Payer: Self-pay | Admitting: Emergency Medicine

## 2017-07-06 VITALS — BP 104/68 | HR 75 | Temp 98.5°F | Resp 16 | Ht 58.25 in | Wt 123.8 lb

## 2017-07-06 DIAGNOSIS — J029 Acute pharyngitis, unspecified: Secondary | ICD-10-CM | POA: Diagnosis not present

## 2017-07-06 MED ORDER — AZITHROMYCIN 250 MG PO TABS: ORAL_TABLET | ORAL | 0 refills | Status: DC

## 2017-07-06 NOTE — Progress Notes (Signed)
Leah Monroe 37 y.o.   Chief Complaint  Patient presents with  . Sore Throat    with difficult swallowing x 3 weeks    HISTORY OF PRESENT ILLNESS: This is a 36 y.o. female complaining of sore throat x 3 weeks.  Sore Throat   This is a new problem. The current episode started 1 to 4 weeks ago. The problem has been gradually worsening. There has been no fever. The pain is at a severity of 3/10. The pain is mild. Associated symptoms include swollen glands and trouble swallowing. Pertinent negatives include no abdominal pain, congestion, coughing, diarrhea, ear pain, headaches, neck pain, shortness of breath or vomiting. She has tried nothing for the symptoms.     Prior to Admission medications   Medication Sig Start Date End Date Taking? Authorizing Provider  cetirizine (ZYRTEC) 10 MG tablet Take 1 tablet (10 mg total) by mouth daily. Patient not taking: Reported on 07/06/2017 11/16/16   Leah Bamberg, PA-C  pseudoephedrine (SUDAFED 12 HOUR) 120 MG 12 hr tablet Take 1 tablet (120 mg total) by mouth 2 (two) times daily. Patient not taking: Reported on 07/06/2017 11/16/16   Leah Bamberg, PA-C    No Known Allergies  Patient Active Problem List   Diagnosis Date Noted  . Acne 10/31/2015    Past Medical History:  Diagnosis Date  . Acne     No past surgical history on file.  Social History   Social History  . Marital status: Married    Spouse name: N/A  . Number of children: 1  . Years of education: N/A   Occupational History  . Not on file.   Social History Main Topics  . Smoking status: Never Smoker  . Smokeless tobacco: Never Used  . Alcohol use No  . Drug use: No  . Sexual activity: Yes    Birth control/ protection: None   Other Topics Concern  . Not on file   Social History Narrative  . No narrative on file    No family history on file.   Review of Systems  Constitutional: Negative.  Negative for chills, fever and malaise/fatigue.  HENT: Positive for sore throat  and trouble swallowing. Negative for congestion, ear pain and nosebleeds.   Eyes: Negative for discharge and redness.  Respiratory: Negative for cough, shortness of breath and wheezing.   Cardiovascular: Negative for chest pain, palpitations and leg swelling.  Gastrointestinal: Negative for abdominal pain, blood in stool, diarrhea, nausea and vomiting.  Genitourinary: Negative.  Negative for dysuria and hematuria.  Musculoskeletal: Negative for neck pain.  Skin: Negative.  Negative for rash.  Neurological: Negative for dizziness, sensory change and headaches.  Endo/Heme/Allergies: Negative.   All other systems reviewed and are negative.  Vitals:   07/06/17 0901  BP: 104/68  Pulse: 75  Resp: 16  Temp: 98.5 F (36.9 C)  SpO2: 100%     Physical Exam  Constitutional: She is oriented to person, place, and time. She appears well-developed and well-nourished.  HENT:  Head: Normocephalic and atraumatic.  Mouth/Throat: Uvula is midline. Posterior oropharyngeal erythema present. No oropharyngeal exudate or tonsillar abscesses.  Eyes: Pupils are equal, round, and reactive to light. Conjunctivae and EOM are normal.  Neck: Normal range of motion. Neck supple. No JVD present. No thyromegaly present.  Cardiovascular: Normal rate, regular rhythm, normal heart sounds and intact distal pulses.   Pulmonary/Chest: Effort normal and breath sounds normal.  Abdominal: Soft. Bowel sounds are normal. There is no tenderness.  Musculoskeletal: Normal  range of motion.  Lymphadenopathy:    She has no cervical adenopathy.  Neurological: She is alert and oriented to person, place, and time.  Skin: Skin is warm and dry. Capillary refill takes less than 2 seconds.  Psychiatric: She has a normal mood and affect. Her behavior is normal.  Vitals reviewed.    ASSESSMENT & PLAN: Leah Monroe was seen today for sore throat.  Diagnoses and all orders for this visit:  Acute pharyngitis, unspecified etiology -      Culture, Group A Strep  Sore throat -     Culture, Group A Strep  Other orders -     azithromycin (ZITHROMAX) 250 MG tablet; Sig as indicated     Patient Instructions       IF you received an x-ray today, you will receive an invoice from [facility]. Please contact [facility] at [phone] with questions or concerns regarding your invoice.   IF you received labwork today, you will receive an invoice from [facility]. Please contact [facility] at [phone] with questions or concerns regarding your invoice.   Our billing staff will not be able to assist you with questions regarding bills from these companies.  You will be contacted with the lab results as soon as they are available. The fastest way to get your results is to activate your My Chart account. Instructions are located on the last page of this paperwork. If you have not heard from [place] regarding the results in 2 weeks, please contact this office.    Vim h?ng (Pharyngitis) Vim h?ng l tnh tr?ng t?y ??, ?au, v s?ng (vim) ho?ng c?a qu v?. NGUYN NHN. Vim h?ng th??ng do nhi?m trng gy ra. Nh?ng nhi?m trng ny ch? y?u do vi rt gy ra (do vi rt) v l m?t ph?n c?a b?nh c?m l?nh. Tuy nhin, ?i khi vim h?ng do vi khu?n gy ra (do vi khu?n). Vim h?ng c?ng c th? do d? ?ng gy ra. Vim h?ng do vi rt c th? ly lan t? ng??i sang ng??i thng qua ho, h?t h?i, v nh?ng v?t d?ng ho?c ?? dng c nhn (c?c, d?a, tha, bn ch?i ?nh r?ng). Vim h?ng do vi khu?n c th? ly lan t? ng??i sang ng??i thng qua ti?p xc g?n g?i h?n, ch?ng h?n nh? hn nhau. D?U HI?U V TRI?U CH?NG Cc tri?u ch?ng c?a vim h?ng bao g?m:  ?au h?ng.  M?t m?i (m?t)  S?t nh?.  ?au ??u.  ?au kh?p ho?c ?au c?.  Pht ban Solicitor.  S?ng h?ch b?ch huy?t.  M?t l?p mng trng gi?ng m?ng bm trong h?ng ho?c ami?an (th??ng nhn th?y ? b?nh vim h?ng do vi khu?n). CH?N ?ON Chuyn gia ch?m Leah Monroe s?c kh?e s? h?i qu v? v? b?nh v cc  tri?u ch?ng c?a qu v?. Th??ng ch? c?n b?nh s? c?a qu v?, cng v?i khm th?c th? ?? c th? ch?n ?on vim h?ng. ?i khi c?n lm xt nghi?m nhanh tm lin c?u khu?n. C th? c?n lm cc xt nghi?m khc, ty thu?c vo nguyn nhn b? nghi ng?. ?I?U TR? Vim h?ng do vi rt th??ng s? ?? trong 3 -4 ngy m khng c?n dng thu?c. Vim h?ng do vi khu?n c th? ???c ?i?u tr? b?ng cc lo?i thu?c di?t vi trng (khng sinh). H??NG D?N CH?M [place] T?I NH  U?ng ?? n??c v ch?t l?ng ?? n??c ti?u trong ho?c c mu vng nh?t.  Ch? s? d?ng thu?c khng c?n k ??n ho?c thu?c c?n  k ??n theo ch? d?n c?a chuyn gia ch?m [place] s?c kh?e: ? N?u qu v? ???c k dng thu?c khng sinh, hy b?o ??m vi?c qu v? dng h?t thu?c ngay c? khi qu v? b?t ??u c?m th?y ?? h?n. ? Khngdng aspirin.  Ngh? ng?i nhi?u.  Xc mi?ng b?ng 8 ao-x? n??c mu?i ( tha c ph mu?i pha vo 1 lt (Anh) n??c) 1 - 2 ti?ng m?t l?n ?? lm d?u h?ng.  Vim ng?m vim h?ng (n?u qu v? khng c nguy c? b? ngh?t th?) ho?c thu?c x?t c th? ???c s? d?ng ?? lm d?u h?ng.  ?I KHM N?U:  Qu v? c c?c s?ng to v c c?m gic ?au ? c? qu v?.  Qu v? b? pht ban.  Qu v? ho ra ??m mu xanh l cy, nu vng, ho?c l?n mu.  NGAY L?P T?C ?I KHM N?U:  C? qu v? b? c?ng.  Qu v? b? ch?y n??c di ho?c khng th? nu?t ch?t l?ng.  Qu v? nn m?a ho?c khng th? gi? thu?c ho?c ch?t l?ng trong c? th?.  Qu v? b? ?au nhi?u m khng kh?i sau khi s? d?ng thu?c theo khuy?n ngh?Ladell Heads v? b? kh th? (khng ph?i l do ng?t m?i).  ??M B?O QU V?:  Hi?u r cc h??ng d?n ny.  S? theo di tnh tr?ng c?a mnh.  S? yu c?u tr? gip ngay l?p t?c n?u qu v? c?m th?y khng kh?e ho?c th?y tr?m tr?ng h?n.  Thng tin ny khng nh?m m?c ?ch thay th? cho l?i khuyn m chuyn gia ch?m [place] s?c kh?e ni v?i qu v?. Hy b?o ??m qu v? ph?i th?o lu?n b?t k? v?n ?? g m qu v? c v?i chuyn gia ch?m [place] s?c kh?e c?a qu v?. Document Released: 09/28/2005 Document Revised:  07/19/2013 Document Reviewed: 06/05/2013 Elsevier Interactive Patient Education  2017 Elsevier Inc.  Sore Throat When you have a sore throat, your throat may:  Hurt.  Burn.  Feel irritated.  Feel scratchy.  Many things can cause a sore throat, including:  An infection.  Allergies.  Dryness in the air.  Smoke or pollution.  Gastroesophageal reflux disease (GERD).  A tumor.  A sore throat can be the first sign of another sickness. It can happen with other problems, like coughing or a fever. Most sore throats go away without treatment. Follow these instructions at home:  Take over-the-counter medicines only as told by your doctor.  Drink enough fluids to keep your pee (urine) clear or pale yellow.  Rest when you feel you need to.  To help with pain, try: ? Sipping warm liquids, such as broth, herbal tea, or warm water. ? Eating or drinking cold or frozen liquids, such as frozen ice pops. ? Gargling with a salt-water mixture 3-4 times a day or as needed. To make a salt-water mixture, add -1 tsp of salt in 1 cup of warm water. Mix it until you cannot see the salt anymore. ? Sucking on hard candy or throat lozenges. ? Putting a cool-mist humidifier in your bedroom at night. ? Sitting in the bathroom with the door closed for 5-10 minutes while you run hot water in the shower.  Do not use any tobacco products, such as cigarettes, chewing tobacco, and e-cigarettes. If you need help quitting, ask your doctor. Contact a doctor if:  You have a fever for more than 2-3 days.  You keep having symptoms for more than 2-3 days.  Your throat does not get better in 7 days.  You have a fever and your symptoms suddenly get worse. Get help right away if:  You have trouble breathing.  You cannot swallow fluids, soft foods, or your saliva.  You have swelling in your throat or neck that gets worse.  You keep feeling like you are going to throw up (vomit).  You keep throwing  up. This information is not intended to replace advice given to you by your health care provider. Make sure you discuss any questions you have with your health care provider. Document Released: 07/07/2008 Document Revised: 05/24/2016 Document Reviewed: 07/19/2015 Elsevier Interactive Patient Education  2018 Elsevier Inc.      Edwina Barth, MD [facility]

## 2017-07-06 NOTE — Patient Instructions (Addendum)
IF you received an x-ray today, you will receive an invoice from [facility]. Please contact [facility] at [phone] with questions or concerns regarding your invoice.   IF you received labwork today, you will receive an invoice from [place]. Please contact [facility] at [phone] with questions or concerns regarding your invoice.   Our billing staff will not be able to assist you with questions regarding bills from these companies.  You will be contacted with the lab results as soon as they are available. The fastest way to get your results is to activate your My Chart account. Instructions are located on the last page of this paperwork. If you have not heard from [place] regarding the results in 2 weeks, please contact this office.    Vim h?ng (Pharyngitis) Vim h?ng l tnh tr?ng t?y ??, ?au, v s?ng (vim) ho?ng c?a qu v?. NGUYN NHN. Vim h?ng th??ng do nhi?m trng gy ra. Nh?ng nhi?m trng ny ch? y?u do vi rt gy ra (do vi rt) v l m?t ph?n c?a b?nh c?m l?nh. Tuy nhin, ?i khi vim h?ng do vi khu?n gy ra (do vi khu?n). Vim h?ng c?ng c th? do d? ?ng gy ra. Vim h?ng do vi rt c th? ly lan t? ng??i sang ng??i thng qua ho, h?t h?i, v nh?ng v?t d?ng ho?c ?? dng c nhn (c?c, d?a, tha, bn ch?i ?nh r?ng). Vim h?ng do vi khu?n c th? ly lan t? ng??i sang ng??i thng qua ti?p xc g?n g?i h?n, ch?ng h?n nh? hn nhau. D?U HI?U V TRI?U CH?NG Cc tri?u ch?ng c?a vim h?ng bao g?m:  ?au h?ng.  M?t m?i (m?t)  S?t nh?.  ?au ??u.  ?au kh?p ho?c ?au c?.  Pht ban Solicitor.  S?ng h?ch b?ch huy?t.  M?t l?p mng trng gi?ng m?ng bm trong h?ng ho?c ami?an (th??ng nhn th?y ? b?nh vim h?ng do vi khu?n). CH?N ?ON Chuyn gia ch?m [place] s?c kh?e s? h?i qu v? v? b?nh v cc tri?u ch?ng c?a qu v?. Th??ng ch? c?n b?nh s? c?a qu v?, cng v?i khm th?c th? ?? c th? ch?n ?on vim h?ng. ?i khi c?n lm xt nghi?m nhanh tm lin c?u khu?n. C th? c?n lm  cc xt nghi?m khc, ty thu?c vo nguyn nhn b? nghi ng?. ?I?U TR? Vim h?ng do vi rt th??ng s? ?? trong 3 -4 ngy m khng c?n dng thu?c. Vim h?ng do vi khu?n c th? ???c ?i?u tr? b?ng cc lo?i thu?c di?t vi trng (khng sinh). H??NG D?N CH?M [place] T?I NH  U?ng ?? n??c v ch?t l?ng ?? n??c ti?u trong ho?c c mu vng nh?t.  Ch? s? d?ng thu?c khng c?n k ??n ho?c thu?c c?n k ??n theo ch? d?n c?a chuyn gia ch?m [place] s?c kh?e: ? N?u qu v? ???c k dng thu?c khng sinh, hy b?o ??m vi?c qu v? dng h?t thu?c ngay c? khi qu v? b?t ??u c?m th?y ?? h?n. ? Khngdng aspirin.  Ngh? ng?i nhi?u.  Xc mi?ng b?ng 8 ao-x? n??c mu?i ( tha c ph mu?i pha vo 1 lt (Anh) n??c) 1 - 2 ti?ng m?t l?n ?? lm d?u h?ng.  Vim ng?m vim h?ng (n?u qu v? khng c nguy c? b? ngh?t th?) ho?c thu?c x?t c th? ???c s? d?ng ?? lm d?u h?ng.  ?I KHM N?U:  Qu v? c c?c s?ng to v c c?m gic ?au ? c? qu v?.  Qu v?  b? pht ban.  Qu v? ho ra ??m mu xanh l cy, nu vng, ho?c l?n mu.  NGAY L?P T?C ?I KHM N?U:  C? qu v? b? c?ng.  Qu v? b? ch?y n??c di ho?c khng th? nu?t ch?t l?ng.  Qu v? nn m?a ho?c khng th? gi? thu?c ho?c ch?t l?ng trong c? th?.  Qu v? b? ?au nhi?u m khng kh?i sau khi s? d?ng thu?c theo khuy?n ngh?Ladell Heads v? b? kh th? (khng ph?i l do ng?t m?i).  ??M B?O QU V?:  Hi?u r cc h??ng d?n ny.  S? theo di tnh tr?ng c?a mnh.  S? yu c?u tr? gip ngay l?p t?c n?u qu v? c?m th?y khng kh?e ho?c th?y tr?m tr?ng h?n.  Thng tin ny khng nh?m m?c ?ch thay th? cho l?i khuyn m chuyn gia ch?m [place] s?c kh?e ni v?i qu v?. Hy b?o ??m qu v? ph?i th?o lu?n b?t k? v?n ?? g m qu v? c v?i chuyn gia ch?m [place] s?c kh?e c?a qu v?. Document Released: 09/28/2005 Document Revised: 07/19/2013 Document Reviewed: 06/05/2013 Elsevier Interactive Patient Education  2017 Elsevier Inc.  Sore Throat When you have a sore throat, your throat may:  Hurt.  Burn.  Feel  irritated.  Feel scratchy.  Many things can cause a sore throat, including:  An infection.  Allergies.  Dryness in the air.  Smoke or pollution.  Gastroesophageal reflux disease (GERD).  A tumor.  A sore throat can be the first sign of another sickness. It can happen with other problems, like coughing or a fever. Most sore throats go away without treatment. Follow these instructions at home:  Take over-the-counter medicines only as told by your doctor.  Drink enough fluids to keep your pee (urine) clear or pale yellow.  Rest when you feel you need to.  To help with pain, try: ? Sipping warm liquids, such as broth, herbal tea, or warm water. ? Eating or drinking cold or frozen liquids, such as frozen ice pops. ? Gargling with a salt-water mixture 3-4 times a day or as needed. To make a salt-water mixture, add -1 tsp of salt in 1 cup of warm water. Mix it until you cannot see the salt anymore. ? Sucking on hard candy or throat lozenges. ? Putting a cool-mist humidifier in your bedroom at night. ? Sitting in the bathroom with the door closed for 5-10 minutes while you run hot water in the shower.  Do not use any tobacco products, such as cigarettes, chewing tobacco, and e-cigarettes. If you need help quitting, ask your doctor. Contact a doctor if:  You have a fever for more than 2-3 days.  You keep having symptoms for more than 2-3 days.  Your throat does not get better in 7 days.  You have a fever and your symptoms suddenly get worse. Get help right away if:  You have trouble breathing.  You cannot swallow fluids, soft foods, or your saliva.  You have swelling in your throat or neck that gets worse.  You keep feeling like you are going to throw up (vomit).  You keep throwing up. This information is not intended to replace advice given to you by your health care provider. Make sure you discuss any questions you have with your health care provider. Document  Released: 07/07/2008 Document Revised: 05/24/2016 Document Reviewed: 07/19/2015 Elsevier Interactive Patient Education  [employer].

## 2017-07-08 LAB — CULTURE, GROUP A STREP: Strep A Culture: NEGATIVE

## 2017-11-08 ENCOUNTER — Ambulatory Visit (INDEPENDENT_AMBULATORY_CARE_PROVIDER_SITE_OTHER): Payer: BLUE CROSS/BLUE SHIELD | Admitting: Women's Health

## 2017-11-08 ENCOUNTER — Encounter: Payer: Self-pay | Admitting: Women's Health

## 2017-11-08 VITALS — BP 122/78 | Ht <= 58 in | Wt 127.0 lb

## 2017-11-08 DIAGNOSIS — Z01419 Encounter for gynecological examination (general) (routine) without abnormal findings: Secondary | ICD-10-CM | POA: Diagnosis not present

## 2017-11-08 LAB — CBC WITH DIFFERENTIAL/PLATELET
BASOS ABS: 27 {cells}/uL (ref 0–200)
Basophils Relative: 0.3 %
EOS PCT: 1.4 %
Eosinophils Absolute: 126 cells/uL (ref 15–500)
HCT: 44.8 % (ref 35.0–45.0)
Hemoglobin: 14.5 g/dL (ref 11.7–15.5)
LYMPHS ABS: 1827 {cells}/uL (ref 850–3900)
MCH: 27 pg (ref 27.0–33.0)
MCHC: 32.4 g/dL (ref 32.0–36.0)
MCV: 83.3 fL (ref 80.0–100.0)
MONOS PCT: 4.9 %
MPV: 10.9 fL (ref 7.5–12.5)
NEUTROS PCT: 73.1 %
Neutro Abs: 6579 cells/uL (ref 1500–7800)
PLATELETS: 271 10*3/uL (ref 140–400)
RBC: 5.38 10*6/uL — ABNORMAL HIGH (ref 3.80–5.10)
RDW: 13.2 % (ref 11.0–15.0)
Total Lymphocyte: 20.3 %
WBC mixed population: 441 cells/uL (ref 200–950)
WBC: 9 10*3/uL (ref 3.8–10.8)

## 2017-11-08 LAB — GLUCOSE, RANDOM: GLUCOSE: 76 mg/dL (ref 65–99)

## 2017-11-08 NOTE — Progress Notes (Signed)
Leah Monroe 05-18-1981 [id number]    History: 37 yo G1P1 MAF presents for annual exam with no complaints other than acne. [place] interpreter present. Monthly 3 day cycle wtihdrawal. No contraception used as patient would like another baby, husband is 42 yo and would not. Normal Pap history. High BP at last visit, WNL today.   Past medical history, past surgical history, family history and social history were all reviewed and documented in the EPIC chart. Works in a factory. Has 33 year old daughter who is present at visit. No family history of breast cancer, diabetes, or hypertension.   ROS:  A ROS was performed and pertinent positives and negatives are included.  Exam: Appears well, able to communicate through interpreter.   Vitals:   11/08/17 1132  BP: 122/78  Weight: 127 lb (57.6 kg)  Height: '4\' 10"'  (1.473 m)   Body mass index is 26.54 kg/m.   General appearance:  Normal Thyroid:  Symmetrical, normal in size, without palpable masses or nodularity. Respiratory  Auscultation:  Clear without wheezing or rhonchi Cardiovascular  Auscultation:  Regular rate, without rubs, murmurs or gallops  Edema/varicosities:  Not grossly evident Abdominal  Soft,nontender, without masses, guarding or rebound.  Liver/spleen:  No organomegaly noted  Hernia:  None appreciated  Skin  Inspection:  Grossly normal   Breasts: Examined lying and sitting.     Right: Without masses, retractions, discharge or axillary adenopathy.     Left: Without masses, retractions, discharge or axillary adenopathy. Gentitourinary   Inguinal/mons:  Normal without inguinal adenopathy  External genitalia:  Normal  BUS/Urethra/Skene's glands:  Normal  Vagina:  Normal  Cervix:  Normal  Uterus:  normal in size, shape and contour.  Midline and mobile  Adnexa/parametria:     Rt: Without masses or tenderness.   Lt: Without masses or tenderness.  Anus and perineum: Normal  Digital rectal exam: Normal sphincter tone  without palpated masses or tenderness  Assessment/Plan:  37 y.o.  MAF G1P1 for annual exam with no complaints other than acne.   Monthly 3 day cycle/withdrawal Acne  Plan: Declines contraception. Recommended OTC Hibiclens for acne esp on chest. SBE's , annual screening mammogram at 40. Encouraged regular exercise, decreasing simple carbs/rice, has had an 8 pound weight gain in the past year. CBC,Glucose, PAP w/ high risk HPV, new screening guidelines reviewed.  Huel Cote [facility], 12:33 PM 11/08/2017

## 2017-11-08 NOTE — Patient Instructions (Addendum)
Mammogram  [phone]  [facility] [employer]   Health Maintenance, Female Adopting a healthy lifestyle and getting preventive care can go a long way to promote health and wellness. Talk with your health care provider about what schedule of regular examinations is right for you. This is a good chance for you to check in with your provider about disease prevention and staying healthy. In between checkups, there are plenty of things you can do on your own. Experts have done a lot of research about which lifestyle changes and preventive measures are most likely to keep you healthy. Ask your health care provider for more information. Weight and diet Eat a healthy diet  Be sure to include plenty of vegetables, fruits, low-fat dairy products, and lean protein.  Do not eat a lot of foods high in solid fats, added sugars, or salt.  Get regular exercise. This is one of the most important things you can do for your health. ? Most adults should exercise for at least 150 minutes each week. The exercise should increase your heart rate and make you sweat (moderate-intensity exercise). ? Most adults should also do strengthening exercises at least twice a week. This is in addition to the moderate-intensity exercise.  Maintain a healthy weight  Body mass index (BMI) is a measurement that can be used to identify possible weight problems. It estimates body fat based on height and weight. Your health care provider can help determine your BMI and help you achieve or maintain a healthy weight.  For females 90 years of age and older: ? A BMI below 18.5 is considered underweight. ? A BMI of 18.5 to 24.9 is normal. ? A BMI of 25 to 29.9 is considered overweight. ? A BMI of 30 and above is considered obese.  Watch levels of cholesterol and blood lipids  You should start having your blood tested for lipids and cholesterol at 39 years of age, then have this test every 5 years.  You may need to have your  cholesterol levels checked more often if: ? Your lipid or cholesterol levels are high. ? You are older than 39 years of age. ? You are at high risk for heart disease.  Cancer screening Lung Cancer  Lung cancer screening is recommended for adults 36-71 years old who are at high risk for lung cancer because of a history of smoking.  A yearly low-dose CT scan of the lungs is recommended for people who: ? Currently smoke. ? Have quit within the past 15 years. ? Have at least a 30-pack-year history of smoking. A pack year is smoking an average of one pack of cigarettes a day for 1 year.  Yearly screening should continue until it has been 15 years since you quit.  Yearly screening should stop if you develop a health problem that would prevent you from having lung cancer treatment.  Breast Cancer  Practice breast self-awareness. This means understanding how your breasts normally appear and feel.  It also means doing regular breast self-exams. Let your health care provider know about any changes, no matter how small.  If you are in your 20s or 30s, you should have a clinical breast exam (CBE) by a health care provider every 1-3 years as part of a regular health exam.  If you are 39 or older, have a CBE every year. Also consider having a breast X-ray (mammogram) every year.  If you have a family history of breast cancer, talk to your health care  provider about genetic screening.  If you are at high risk for breast cancer, talk to your health care provider about having an MRI and a mammogram every year.  Breast cancer gene (BRCA) assessment is recommended for women who have family members with BRCA-related cancers. BRCA-related cancers include: ? Breast. ? Ovarian. ? Tubal. ? Peritoneal cancers.  Results of the assessment will determine the need for genetic counseling and BRCA1 and BRCA2 testing.  Cervical Cancer Your health care provider may recommend that you be screened regularly  for cancer of the pelvic organs (ovaries, uterus, and vagina). This screening involves a pelvic examination, including checking for microscopic changes to the surface of your cervix (Pap test). You may be encouraged to have this screening done every 3 years, beginning at age 21.  For women ages 30-65, health care providers may recommend pelvic exams and Pap testing every 3 years, or they may recommend the Pap and pelvic exam, combined with testing for human papilloma virus (HPV), every 5 years. Some types of HPV increase your risk of cervical cancer. Testing for HPV may also be done on women of any age with unclear Pap test results.  Other health care providers may not recommend any screening for nonpregnant women who are considered low risk for pelvic cancer and who do not have symptoms. Ask your health care provider if a screening pelvic exam is right for you.  If you have had past treatment for cervical cancer or a condition that could lead to cancer, you need Pap tests and screening for cancer for at least 20 years after your treatment. If Pap tests have been discontinued, your risk factors (such as having a new sexual partner) need to be reassessed to determine if screening should resume. Some women have medical problems that increase the chance of getting cervical cancer. In these cases, your health care provider may recommend more frequent screening and Pap tests.  Colorectal Cancer  This type of cancer can be detected and often prevented.  Routine colorectal cancer screening usually begins at 39 years of age and continues through 39 years of age.  Your health care provider may recommend screening at an earlier age if you have risk factors for colon cancer.  Your health care provider may also recommend using home test kits to check for hidden blood in the stool.  A small camera at the end of a tube can be used to examine your colon directly (sigmoidoscopy or colonoscopy). This is done to  check for the earliest forms of colorectal cancer.  Routine screening usually begins at age 50.  Direct examination of the colon should be repeated every 5-10 years through 39 years of age. However, you may need to be screened more often if early forms of precancerous polyps or small growths are found.  Skin Cancer  Check your skin from head to toe regularly.  Tell your health care provider about any new moles or changes in moles, especially if there is a change in a mole's shape or color.  Also tell your health care provider if you have a mole that is larger than the size of a pencil eraser.  Always use sunscreen. Apply sunscreen liberally and repeatedly throughout the day.  Protect yourself by wearing long sleeves, pants, a wide-brimmed hat, and sunglasses whenever you are outside.  Heart disease, diabetes, and high blood pressure  High blood pressure causes heart disease and increases the risk of stroke. High blood pressure is more likely to develop in: ?   People who have blood pressure in the high end of the normal range (130-139/85-89 mm Hg). ? People who are overweight or obese. ? People who are African American.  If you are 18-39 years of age, have your blood pressure checked every 3-5 years. If you are 40 years of age or older, have your blood pressure checked every year. You should have your blood pressure measured twice-once when you are at a hospital or clinic, and once when you are not at a hospital or clinic. Record the average of the two measurements. To check your blood pressure when you are not at a hospital or clinic, you can use: ? An automated blood pressure machine at a pharmacy. ? A home blood pressure monitor.  If you are between 55 years and 79 years old, ask your health care provider if you should take aspirin to prevent strokes.  Have regular diabetes screenings. This involves taking a blood sample to check your fasting blood sugar level. ? If you are at a  normal weight and have a low risk for diabetes, have this test once every three years after 39 years of age. ? If you are overweight and have a high risk for diabetes, consider being tested at a younger age or more often. Preventing infection Hepatitis B  If you have a higher risk for hepatitis B, you should be screened for this virus. You are considered at high risk for hepatitis B if: ? You were born in a country where hepatitis B is common. Ask your health care provider which countries are considered high risk. ? Your parents were born in a high-risk country, and you have not been immunized against hepatitis B (hepatitis B vaccine). ? You have HIV or AIDS. ? You use needles to inject street drugs. ? You live with someone who has hepatitis B. ? You have had sex with someone who has hepatitis B. ? You get hemodialysis treatment. ? You take certain medicines for conditions, including cancer, organ transplantation, and autoimmune conditions.  Hepatitis C  Blood testing is recommended for: ? Everyone born from 1945 through 1965. ? Anyone with known risk factors for hepatitis C.  Sexually transmitted infections (STIs)  You should be screened for sexually transmitted infections (STIs) including gonorrhea and chlamydia if: ? You are sexually active and are younger than 39 years of age. ? You are older than 39 years of age and your health care provider tells you that you are at risk for this type of infection. ? Your sexual activity has changed since you were last screened and you are at an increased risk for chlamydia or gonorrhea. Ask your health care provider if you are at risk.  If you do not have HIV, but are at risk, it may be recommended that you take a prescription medicine daily to prevent HIV infection. This is called pre-exposure prophylaxis (PrEP). You are considered at risk if: ? You are sexually active and do not regularly use condoms or know the HIV status of your  partner(s). ? You take drugs by injection. ? You are sexually active with a partner who has HIV.  Talk with your health care provider about whether you are at high risk of being infected with HIV. If you choose to begin PrEP, you should first be tested for HIV. You should then be tested every 3 months for as long as you are taking PrEP. Pregnancy  If you are premenopausal and you may become pregnant, ask your health   care provider about preconception counseling.  If you may become pregnant, take 400 to 800 micrograms (mcg) of folic acid every day.  If you want to prevent pregnancy, talk to your health care provider about birth control (contraception). Osteoporosis and menopause  Osteoporosis is a disease in which the bones lose minerals and strength with aging. This can result in serious bone fractures. Your risk for osteoporosis can be identified using a bone density scan.  If you are 65 years of age or older, or if you are at risk for osteoporosis and fractures, ask your health care provider if you should be screened.  Ask your health care provider whether you should take a calcium or vitamin D supplement to lower your risk for osteoporosis.  Menopause may have certain physical symptoms and risks.  Hormone replacement therapy may reduce some of these symptoms and risks. Talk to your health care provider about whether hormone replacement therapy is right for you. Follow these instructions at home:  Schedule regular health, dental, and eye exams.  Stay current with your immunizations.  Do not use any tobacco products including cigarettes, chewing tobacco, or electronic cigarettes.  If you are pregnant, do not drink alcohol.  If you are breastfeeding, limit how much and how often you drink alcohol.  Limit alcohol intake to no more than 1 drink per day for nonpregnant women. One drink equals 12 ounces of beer, 5 ounces of wine, or 1 ounces of hard liquor.  Do not use street  drugs.  Do not share needles.  Ask your health care provider for help if you need support or information about quitting drugs.  Tell your health care provider if you often feel depressed.  Tell your health care provider if you have ever been abused or do not feel safe at home. This information is not intended to replace advice given to you by your health care provider. Make sure you discuss any questions you have with your health care provider. Document Released: 04/13/2011 Document Revised: 03/05/2016 Document Reviewed: 07/02/2015 Elsevier Interactive Patient Education  2018 [employer] Inc.  

## 2017-11-11 LAB — PAP, TP IMAGING W/ HPV RNA, RFLX HPV TYPE 16,18/45: HPV DNA HIGH RISK: NOT DETECTED

## 2017-11-26 ENCOUNTER — Ambulatory Visit (INDEPENDENT_AMBULATORY_CARE_PROVIDER_SITE_OTHER): Payer: BLUE CROSS/BLUE SHIELD | Admitting: Family Medicine

## 2017-11-26 ENCOUNTER — Encounter: Payer: Self-pay | Admitting: Family Medicine

## 2017-11-26 ENCOUNTER — Other Ambulatory Visit: Payer: Self-pay

## 2017-11-26 VITALS — BP 98/64 | HR 85 | Temp 98.0°F | Ht 58.47 in | Wt 126.0 lb

## 2017-11-26 DIAGNOSIS — J029 Acute pharyngitis, unspecified: Secondary | ICD-10-CM | POA: Diagnosis not present

## 2017-11-26 DIAGNOSIS — R0982 Postnasal drip: Secondary | ICD-10-CM

## 2017-11-26 LAB — POCT RAPID STREP A (OFFICE): Rapid Strep A Screen: NEGATIVE

## 2017-11-26 MED ORDER — AZELASTINE HCL 0.1 % NA SOLN: 1.0000 | Freq: Two times a day (BID) | NASAL | 0 refills | Status: DC

## 2017-11-26 NOTE — Patient Instructions (Addendum)
   IF you received an x-ray today, you will receive an invoice from [facility]. Please contact  Radiology at [phone] with questions or concerns regarding your invoice.   IF you received labwork today, you will receive an invoice from [facility]. Please contact [facility] at [phone] with questions or concerns regarding your invoice.   Our billing staff will not be able to assist you with questions regarding bills from these companies.  You will be contacted with the lab results as soon as they are available. The fastest way to get your results is to activate your My Chart account. Instructions are located on the last page of this paperwork. If you have not heard from us regarding the results in 2 weeks, please contact this office.     Postnasal Drip Postnasal drip is the feeling of mucus going down the back of your throat. Mucus is a slimy substance that moistens and cleans your nose and throat, as well as the air pockets in face bones near your forehead and cheeks (sinuses). Small amounts of mucus pass from your nose and sinuses down the back of your throat all the time. This is normal. When you produce too much mucus or the mucus gets too thick, you can feel it. Some common causes of postnasal drip include:  Having more mucus because of: ? A cold or the flu. ? Allergies. ? Cold air. ? Certain medicines.  Having more mucus that is thicker because of: ? A sinus or nasal infection. ? Dry air. ? A food allergy.  Follow these instructions at home: Relieving discomfort  Gargle with a salt-water mixture 3-4 times a day or as needed. To make a salt-water mixture, completely dissolve -1 tsp of salt in 1 cup of warm water.  If the air in your home is dry, use a humidifier to add moisture to the air.  Use a saline spray or container (neti pot) to flush out the nose (nasal irrigation). These methods can help clear away mucus and keep the nasal passages moist. General  instructions  Take over-the-counter and prescription medicines only as told by your health care provider.  Follow instructions from your health care provider about eating or drinking restrictions. You may need to avoid caffeine.  Avoid things that you know you are allergic to (allergens), like dust, mold, pollen, pets, or certain foods.  Drink enough fluid to keep your urine pale yellow.  Keep all follow-up visits as told by your health care provider. This is important. Contact a health care provider if:  You have a fever.  You have a sore throat.  You have difficulty swallowing.  You have headache.  You have sinus pain.  You have a cough that does not go away.  The mucus from your nose becomes thick and is green or yellow in color.  You have cold or flu symptoms that last more than 10 days. Summary  Postnasal drip is the feeling of mucus going down the back of your throat.  If your health care provider approves, use nasal irrigation or a nasal spray 2?4 times a day.  Avoid things that you know you are allergic to (allergens), like dust, mold, pollen, pets, or certain foods. This information is not intended to replace advice given to you by your health care provider. Make sure you discuss any questions you have with your health care provider. Document Released: 01/11/2017 Document Revised: 01/11/2017 Document Reviewed: 01/11/2017 Elsevier Interactive Patient Education  2018 [employer]vier Inc.  

## 2017-11-26 NOTE — Progress Notes (Signed)
   2/15/20199:59 AM  Leah Monroe 11/18/1980, 37 y.o. female [id number]  Chief Complaint  Patient presents with  . Sore Throat    has been coughing for 3 wks     HPI:   Patient is a 37 y.o. female  who presents today for 3 weeks of right sided sore throat, painful to swallow, intermittent runny nose, nasal congestion and cough, no fever or chills. No sob, nausea, vomiting or diarrhea. Worried this might be strep as it has not resolved. She takes ibuprofen as needed for pain but otherwise has not taken anything else.   [facility] 11/26/2017 07/06/2017 11/16/2016  Decreased Interest 0 0 0  Down, Depressed, Hopeless 0 0 0  PHQ - 2 Score 0 0 0    No Known Allergies  Prior to Admission medications   Not on File    Past Medical History:  Diagnosis Date  . Acne     History reviewed. No pertinent surgical history.  Social History   Tobacco Use  . Smoking status: Never Smoker  . Smokeless tobacco: Never Used  Substance Use Topics  . Alcohol use: Yes    History reviewed. No pertinent family history.  ROS Per hpi  OBJECTIVE:  Blood pressure 98/64, pulse 85, temperature 98 F (36.7 C), temperature source Oral, height 4' 10.47" (1.485 m), weight 126 lb (57.2 kg), SpO2 100 %.  Physical Exam  Constitutional: She is oriented to person, place, and time and well-developed, well-nourished, and in no distress.  HENT:  Head: Normocephalic and atraumatic.  Right Ear: Hearing, tympanic membrane, external ear and ear canal normal.  Left Ear: Hearing, tympanic membrane, external ear and ear canal normal.  Mouth/Throat: Mucous membranes are normal. Posterior oropharyngeal edema and posterior oropharyngeal erythema present. No oropharyngeal exudate.  Eyes: EOM are normal. Pupils are equal, round, and reactive to light.  Neck: Neck supple.  Cardiovascular: Normal rate, regular rhythm and normal heart sounds. Exam reveals no gallop and no friction rub.  No murmur  heard. Pulmonary/Chest: Effort normal and breath sounds normal. She has no wheezes. She has no rales.  Lymphadenopathy:    She has no cervical adenopathy.  Neurological: She is alert and oriented to person, place, and time. Gait normal.  Skin: Skin is warm and dry.     Results for orders placed or performed in visit on 11/26/17 (from the past 24 hour(s))  POCT rapid strep A     Status: None   Collection Time: 11/26/17 10:20 AM  Result Value Ref Range   Rapid Strep A Screen Negative Negative    ASSESSMENT and PLAN  1. Sore throat - POCT rapid strep A 2. Post-nasal drainage Discussed supportive measures, new meds r/se/b and RTC precautions. Patient educational handout given.  Other orders - azelastine (ASTELIN) 0.1 % nasal spray; Place 1 spray into both nostrils 2 (two) times daily. Use in each nostril as directed  Return if symptoms worsen or fail to improve.    Myles LippsIrma M Santiago, MD [facility] [address] [place], [place] [id number] Ph.  [phone] Fax [phone]

## 2017-12-23 ENCOUNTER — Other Ambulatory Visit: Payer: Self-pay | Admitting: Family Medicine

## 2018-01-28 ENCOUNTER — Ambulatory Visit (INDEPENDENT_AMBULATORY_CARE_PROVIDER_SITE_OTHER): Payer: BLUE CROSS/BLUE SHIELD | Admitting: Family Medicine

## 2018-01-28 ENCOUNTER — Encounter: Payer: Self-pay | Admitting: Family Medicine

## 2018-01-28 ENCOUNTER — Other Ambulatory Visit: Payer: Self-pay

## 2018-01-28 ENCOUNTER — Ambulatory Visit (INDEPENDENT_AMBULATORY_CARE_PROVIDER_SITE_OTHER): Payer: BLUE CROSS/BLUE SHIELD

## 2018-01-28 VITALS — BP 122/72 | HR 79 | Temp 98.1°F | Ht 59.06 in | Wt 126.8 lb

## 2018-01-28 DIAGNOSIS — R310 Gross hematuria: Secondary | ICD-10-CM | POA: Diagnosis not present

## 2018-01-28 DIAGNOSIS — R300 Dysuria: Secondary | ICD-10-CM

## 2018-01-28 DIAGNOSIS — R1031 Right lower quadrant pain: Secondary | ICD-10-CM

## 2018-01-28 LAB — POCT URINALYSIS DIP (MANUAL ENTRY)
Bilirubin, UA: NEGATIVE
Glucose, UA: NEGATIVE mg/dL
Ketones, POC UA: NEGATIVE mg/dL
Nitrite, UA: NEGATIVE
Spec Grav, UA: 1.02 (ref 1.010–1.025)
Urobilinogen, UA: 0.2 E.U./dL
pH, UA: 5.5 (ref 5.0–8.0)

## 2018-01-28 MED ORDER — TAMSULOSIN HCL 0.4 MG PO CAPS
0.4000 mg | ORAL_CAPSULE | Freq: Every day | ORAL | 0 refills | Status: DC
Start: 2018-01-28 — End: 2018-03-26

## 2018-01-28 MED ORDER — IBUPROFEN 600 MG PO TABS: 600.0000 mg | ORAL_TABLET | Freq: Three times a day (TID) | ORAL | 0 refills | Status: DC | PRN

## 2018-01-28 NOTE — Patient Instructions (Addendum)
IF you received an x-ray today, you will receive an invoice from [facility]. Please contact [facility] at [phone] with questions or concerns regarding your invoice.   IF you received labwork today, you will receive an invoice from [place]. Please contact [facility] at [phone] with questions or concerns regarding your invoice.   Our billing staff will not be able to assist you with questions regarding bills from these companies.  You will be contacted with the lab results as soon as they are available. The fastest way to get your results is to activate your My Chart account. Instructions are located on the last page of this paperwork. If you have not heard from [place] regarding the results in 2 weeks, please contact this office.     S?i th?n Kidney Stones S?i th?n (s?i ti?t ni?u) l ph?n l?ng c?n r?n, gi?ng nh? ?, hnh thnh bn trong c? quan t?o ra n??c ti?u (th?n). S?i th?n c th? hnh thnh trong th?n v di chuy?n vo bng quang, ? ?, n c th? gy ?au d? d?i v ch?n dng n??c ti?u. S?i th?n ???c hnh thnh khi n?ng ?? cc khong ch?t nh?t ??nh trong n??c ti?u ? m?c cao. S?i th??ng ???c th?i ra ngoi qua ti?u ti?n, nh?ng trong m?t s? tr??ng h?p, c th? c?n ph?i ?i?u tr? y [place] ?? lo?i b? s?i. Nguyn nhn g gy ra? S?i th?n c th? do:  Tnh tr?ng m trong ? cc tuy?n nh?t ??nh t?o ra qu nhi?u hormone tuy?t c?n gip (c??ng c?n gip nguyn pht), d?n ??n tch c? qu nhi?u canxi trong mu.  Tch t? cc tinh th? axit uric trong bng quang (t?ng uric ni?u). Axit uric l ch?t ha h?c do c? th? s?n xu?t ra khi qu v? ?n m?t s? lo?i th?c ph?m nh?t ??nh. Ch?t ny th??ng ra kh?i c? th? qua n??c ti?u.  S? thu h?p (cht h?p) m?t ho?c c? hai ?ng d?n n??c ti?u t? th?n xu?ng bng quang (ni?u qu?n).  Ph?n t?c ngh?n ? th?n xu?t hi?n khi sinh (t?c ngh?n b?m sinh).  Ti?n s? ph?u thu?t ? th?n ho?c ni?u qu?n, ch?ng h?n nh? ph?u thu?t n?i t?t d? dy.  ?i?u g lm t?ng nguy  c?? Nh?ng y?u t? sau c th? lm cho qu v? d? b? s?i th?n h?n:  C ti?n s? b? s?i th?n.  C ti?n s? gia ?nh b? s?i th?n.  Khng u?ng ?? n??c.  ?n ch? ?? ?n giu protein, mu?i (natri), ho?c ???ng.  Th?a cn ho?c bo ph.  Cc d?u hi?u ho?c tri?u ch?ng l g? Cc tri?u ch?ng s?i th?n c th? bao g?m:  Bu?n nn.  Nn.  Ma?u trong n???c ti?u (ti?u ti?n ra mu).  ?au ? ph?n bn c?nh b?ng, bn ph?i, d??i s??ng s??n (?au hng). ?au th??ng lan (t?a ra) xu?ng b?n.  C?n ?i ti?u th??ng xuyn ho?c g?p gp.  Ch?n ?on tnh tr?ng ny nh? th? no? Tnh tr?ng ny c th? ???c ch?n ?on d?a vo:  B?nh s? c?a qu v?.  Khm th?c th?.  Xt nghi?m mu.  Xt nghi?m n??c ti?u.  Ch?p CT.  Ch?p X-quang b?ng.  Th? thu?t khm bn trong bng quang (n?i soi bng quang).  Tnh tr?ng ny ???c ?i?u tr? nh? th? no? ?i?u tr? s?i th?n ph? thu?c vo kch th??c, v? tr v thnh ph?n c?a s?i. ?i?u tr? c th? bao g?m:  Phn tch n??c ti?u tr??c v sau  khi qu v? th?i s?i qua ti?u ti?n.  ???c theo di t?i b?nh vi?n cho ??n khi qu v? th?i s?i qua ti?u ti?n.  T?ng l??ng n??c u?ng v gi?m l??ng canxi v protein trong kh?u ph?n ?n.  Th? thu?t tn s?i th?n trong bng quang s? d?ng: ? Chm nh sng h?i t? (li?u php laze). ? Sng xung kch (tn s?i b?ng sng xung kch ngoi c? th?).  Ph?u thu?t lo?i b? s?i th?n. Vi?c ny c th? c?n thi?t n?u qu v? b? ?au d? d?i ho?c c s?i lm t?c ???ng ni?u.  Tun th? nh?ng h??ng d?n ny ? nh: ?n v u?ng   U?ng ?? n??c ?? gi? cho n??c ti?u trong ho?c c mu vng nh?t. Vi?c ny s? gip qu v? th?i s?i ra ngoi.  N?u ???c ch? d?n, hy thay ??i ch? ?? ?n. Vi?c ny c th? bao g?m: ? Gi?i h?n l??ng natri qu v? ?n vo. ? ?n nhi?u tri cy v rau c? h?n. ? H?n ch? l??ng th?t ??, th?t gia c?m, c v tr?ng qu v? ?n vo.  Tun th? ch? d?n c?a chuyn gia ch?m [place] s?c kh?e v? cc h?n ch? ?n ho?c u?ng. H??ng d?n chung  L?y m?u n??c ti?u theo ch? d?n c?a chuyn  gia ch?m [place] s?c kh?e. Qu v? c th? c?n l?y m?t m?u n??c ti?u: ? Sau khi qu v? th?i s?i ra ngoi 24 gi?. ? Sau khi qu v? th?i s?i th?n 8--12 tu?n v sau ? l 6-12 thng m?t l?n.  L?c n??c ti?u m?i l?n qu v? ?i ti?u, trong th?i gian theo ch? d?n. S? d?ng d?ng c? l?c m chuyn gia ch?m [place] s?c kh?e c?a qu v? khuy?n ngh?Imagene Sheller v?t b? s?i th?n sau khi qu v? th?i ra ngoi. Gi? s?i ? l?i ?? chuyn gia ch?m [place] s?c kh?e c th? ki?m tra. Ki?m tra thnh ph?n c?a s?i th?n c th? gip qu v? phng ng?a s?i th?n trong t??ng lai.  Ch? s? d?ng thu?c khng k ??n v thu?c k ??n theo ch? d?n c?a chuyn gia ch?m [place] s?c kh?e.  Tun th? t?t c? cc l?n khm theo di theo ch? d?n c?a chuyn gia ch?m [place] s?c kh?e. ?i?u ny c vai tr quan tr?ng. Qu v? c th? c?n ch?p X-quang ho?c siu m theo di ?? ??m b?o r?ng s?i c?a qu v? ? ???c th?i ra ngoi. Ng?n ng?a tnh tr?ng ny b?ng cch no? ?? phng ng?a s?i th?n khc:  U?ng ?? n??c ?? gi? cho n??c ti?u trong ho?c c mu vng nh?t. ?y l cch t?t nh?t ?? phng ng?a s?i th?n.  ?n ch? ?? ?n lnh m?nh v tun th? nh?ng l?i khuyn c?a chuyn gia ch?m [place] s?c kh?e v? th?c ph?m c?n trnh. Qu v? c th? ???c h??ng d?n ?n kh?u ph?n t protein. Cc khuy?n ngh? thay ??i ty thu?c vo lo?i s?i th?n m qu v? c.  Duy tr m??c cn n?ng c l?i cho s?c kh?e.  Hy lin l?c v?i chuyn gia ch?m [place] s?c kh?e n?u:  N?u quy? vi? bi? ?au tr?m tr?ng h?n ho??c khng ??? sau khi du?ng thu?c. Yu c?u tr? gip ngay l?p t?c n?u:  Qu v? b? s?t ho?c ?n l?nh.  Qu v? b? ?au nhi?u.  Qu v? c c?n ?au b?ng m?i.  Qu v? b? ng?t.  Qu v? khng th? ?i ti?u ???c. Thng tin ny khng nh?m m?c ?ch  thay th? cho l?i khuyn m chuyn gia ch?m [place] s?c kh?e ni v?i qu v?. Hy b?o ??m qu v? ph?i th?o lu?n b?t k? v?n ?? g m qu v? c v?i chuyn gia ch?m [place] s?c kh?e c?a qu v?. Document Released: 09/28/2005 Document Revised: 01/13/2017 Document Reviewed: 03/13/2016 Elsevier  Interactive Patient Education  2018 [employer].

## 2018-01-28 NOTE — Progress Notes (Signed)
4/19/20192:34 PM  Carrington Porto 1981/09/12, 37 y.o. female [id number]  Chief Complaint  Patient presents with  . Abdominal Pain    having pain when she urinates since Tuesday. stomach and right side pain    HPI:   Patient is a 37 y.o. female who presents today for 4 days of RLQ pain, cramping, dysuria and gross hematuria x 1. Denies any suprapubic pain, urgency or frequency. Denies any fever, chills, nausea, vomiting, changes in stools. Denies any vaginal discharge. Denies any h/o kidney stones.   Depression screen [facility] 2/9 01/28/2018 11/26/2017 07/06/2017  Decreased Interest 0 0 0  Down, Depressed, Hopeless 0 0 0  PHQ - 2 Score 0 0 0    No Known Allergies  Prior to Admission medications   Not on File    Past Medical History:  Diagnosis Date  . Acne     History reviewed. No pertinent surgical history.  Social History   Tobacco Use  . Smoking status: Never Smoker  . Smokeless tobacco: Never Used  Substance Use Topics  . Alcohol use: Yes    History reviewed. No pertinent family history.  ROS Per hpi  OBJECTIVE:  Blood pressure 122/72, pulse 79, temperature 98.1 F (36.7 C), temperature source Oral, height 4' 11.06" (1.5 m), weight 126 lb 12.8 oz (57.5 kg), last menstrual period 01/13/2018, SpO2 100 %.  Physical Exam  Constitutional: She is oriented to person, place, and time.  HENT:  Head: Normocephalic and atraumatic.  Mouth/Throat: Oropharynx is clear and moist. No oropharyngeal exudate.  Eyes: Pupils are equal, round, and reactive to light. EOM are normal. No scleral icterus.  Neck: Neck supple.  Cardiovascular: Normal rate, regular rhythm and normal heart sounds. Exam reveals no gallop and no friction rub.  No murmur heard. Pulmonary/Chest: Effort normal and breath sounds normal. She has no wheezes. She has no rales.  Abdominal: Soft. Bowel sounds are normal. There is tenderness in the right lower quadrant. There is no rebound, no guarding, no CVA tenderness  and no tenderness at McBurney's point.  Musculoskeletal: She exhibits no edema.  Neurological: She is alert and oriented to person, place, and time.  Skin: Skin is warm and dry.      Results for orders placed or performed in visit on 01/28/18 (from the past 24 hour(s))  POCT urinalysis dipstick     Status: Abnormal   Collection Time: 01/28/18  2:41 PM  Result Value Ref Range   Color, UA yellow yellow   Clarity, UA clear clear   Glucose, UA negative negative mg/dL   Bilirubin, UA negative negative   Ketones, POC UA negative negative mg/dL   Spec Grav, UA 4.098 1.191 - 1.025   Blood, UA moderate (A) negative   pH, UA 5.5 5.0 - 8.0   Protein Ur, POC trace (A) negative mg/dL   Urobilinogen, UA 0.2 0.2 or 1.0 E.U./dL   Nitrite, UA Negative Negative   Leukocytes, UA Trace (A) Negative    Dg Abd 1 View  Result Date: 01/28/2018 CLINICAL DATA:  Two days of RIGHT LOWER QUADRANT pain. EXAM: ABDOMEN - 1 VIEW COMPARISON:  None. FINDINGS: The bowel gas pattern is normal. No radio-opaque calculi or other significant radiographic abnormality are seen. IMPRESSION: Negative. Electronically Signed   By: Norva Pavlov M.D.   On: 01/28/2018 15:06     ASSESSMENT and PLAN  1. Right lower quadrant abdominal pain Treating empirically for kidney stones, urine culture sent. Urine strainer provided. RTC precautions given. - POCT  urinalysis dipstick - DG Abd 1 View; Future  2. Dysuria - DG Abd 1 View; Future - Urine Culture  3. Gross hematuria - DG Abd 1 View; Future - Urine Culture  Other orders - ibuprofen (ADVIL,MOTRIN) 600 MG tablet; Take 1 tablet (600 mg total) by mouth every 8 (eight) hours as needed. - tamsulosin (FLOMAX) 0.4 MG CAPS capsule; Take 1 capsule (0.4 mg total) by mouth daily.  Return if symptoms worsen or fail to improve.    Myles LippsIrma M Santiago, MD Primary Care at [facility] [address] [place], [place] [address] Ph.  [phone] Fax [phone]

## 2018-01-29 LAB — URINE CULTURE

## 2018-03-06 ENCOUNTER — Other Ambulatory Visit: Payer: Self-pay | Admitting: Family Medicine

## 2018-03-26 ENCOUNTER — Ambulatory Visit (INDEPENDENT_AMBULATORY_CARE_PROVIDER_SITE_OTHER): Payer: BLUE CROSS/BLUE SHIELD | Admitting: Physician Assistant

## 2018-03-26 ENCOUNTER — Encounter: Payer: Self-pay | Admitting: Physician Assistant

## 2018-03-26 ENCOUNTER — Other Ambulatory Visit: Payer: Self-pay

## 2018-03-26 VITALS — BP 118/70 | HR 78 | Temp 98.0°F | Resp 16 | Ht <= 58 in | Wt 126.6 lb

## 2018-03-26 DIAGNOSIS — S39012A Strain of muscle, fascia and tendon of lower back, initial encounter: Secondary | ICD-10-CM

## 2018-03-26 MED ORDER — CYCLOBENZAPRINE HCL 10 MG PO TABS: 5.0000 mg | ORAL_TABLET | Freq: Three times a day (TID) | ORAL | 0 refills | Status: DC | PRN

## 2018-03-26 MED ORDER — NAPROXEN 500 MG PO TABS: 500.0000 mg | ORAL_TABLET | Freq: Two times a day (BID) | ORAL | 0 refills | Status: DC

## 2018-03-26 NOTE — Patient Instructions (Addendum)
Use a heating pad on the affected area.  Come back in about 3 weeks if you are still having bad symptoms.     IF you received an x-ray today, you will receive an invoice from [facility]. Please contact [facility] at [phone] with questions or concerns regarding your invoice.   IF you received labwork today, you will receive an invoice from [facility]. Please contact [facility] at [phone] with questions or concerns regarding your invoice.   Our billing staff will not be able to assist you with questions regarding bills from these companies.  You will be contacted with the lab results as soon as they are available. The fastest way to get your results is to activate your My Chart account. Instructions are located on the last page of this paperwork. If you have not heard from us regarding the results in 2 weeks, please contact this office.

## 2018-03-26 NOTE — Progress Notes (Signed)
    03/29/2018 2:09 PM   DOB: 01/30/1981 / MRN: [id number]  SUBJECTIVE:  Leah Monroe is a 37 y.o. female presenting for Back Pain: Patient presents for presents evaluation of low back problems.  Symptoms have been present for 3 days and include pain in low back (aching in character; 6/10 in severity). Initial inciting event: none. Symptoms are worst: morning. Alleviating factors identifiable by patient are walking. Exacerbating factors identifiable by patient are walking uphill. Treatments so far initiated by patient: none Previous lower back problems: none. Previous workup: none. Previous treatments: none.    She has No Known Allergies.   She  has a past medical history of Acne.    She  reports that she has never smoked. She has never used smokeless tobacco. She reports that she drinks alcohol. She reports that she does not use drugs. She  reports that she currently engages in sexual activity. She reports using the following method of birth control/protection: None. The patient  has no past surgical history on file.  Her family history is not on file.  Review of Systems  Eyes: Negative.   Musculoskeletal: Positive for back pain and myalgias. Negative for falls, joint pain and neck pain.  Neurological: Negative for dizziness, sensory change, speech change, focal weakness and headaches.    The problem list and medications were reviewed and updated by myself where necessary and exist elsewhere in the encounter.   OBJECTIVE:  BP 118/70   Pulse 78   Temp 98 F (36.7 C) (Oral)   Resp 16   Ht 4' 9.48" (1.46 m)   Wt 126 lb 9.6 oz (57.4 kg)   SpO2 100%   BMI 26.94 kg/m   Physical Exam  Constitutional: She is oriented to person, place, and time. She appears well-nourished. No distress.  Eyes: Pupils are equal, round, and reactive to light. EOM are normal.  Cardiovascular: Normal rate.  Pulmonary/Chest: Effort normal.  Abdominal: She exhibits no distension.  Musculoskeletal: She exhibits  no edema, tenderness or deformity.  Neurological: She is alert and oriented to person, place, and time. She displays normal reflexes. No cranial nerve deficit or sensory deficit. She exhibits normal muscle tone. Coordination and gait normal.  Skin: Skin is dry. She is not diaphoretic.  Psychiatric: She has a normal mood and affect.  Vitals reviewed.   No results found for this or any previous visit (from the past 72 hour(s)).  No results found.  ASSESSMENT AND PLAN:  Leah FillersHrai was seen today for back pain.  Diagnoses and all orders for this visit:  Back strain, initial encounter -     naproxen (NAPROSYN) 500 MG tablet; Take 1 tablet (500 mg total) by mouth 2 (two) times daily with a meal. -     cyclobenzaprine (FLEXERIL) 10 MG tablet; Take 0.5-1 tablets (5-10 mg total) by mouth 3 (three) times daily as needed.    The patient is advised to call or return to clinic if she does not see an improvement in symptoms, or to seek the care of the closest emergency department if she worsens with the above plan.   Deliah BostonMichael Clark, MHS, PA-C Primary Care at [facility] 03/29/2018 2:09 PM

## 2018-04-13 ENCOUNTER — Other Ambulatory Visit: Payer: Self-pay | Admitting: Physician Assistant

## 2018-04-13 DIAGNOSIS — S39012A Strain of muscle, fascia and tendon of lower back, initial encounter: Secondary | ICD-10-CM

## 2018-04-13 NOTE — Telephone Encounter (Signed)
Naproxen 500 mg refill request  LOV 03/26/18 with Deliah BostonMichael Clark.    No refills given by M.Clark.     [facility] [address] [facility] [place], [place]

## 2018-04-13 NOTE — Telephone Encounter (Signed)
Patient is requesting a refill of the following medications: Requested Prescriptions   Pending Prescriptions Disp Refills  . naproxen (NAPROSYN) 500 MG tablet [Pharmacy Med Name: NAPROXEN 500 MG TABLET] 42 tablet 0    Sig: TAKE 1 TABLET (500 MG TOTAL) BY MOUTH 2 (TWO) TIMES DAILY WITH A MEAL.    Date of patient request: 04/13/18 Last office visit: 03/26/18  Date of last refill: 03/26/18 Last refill amount: #42 0RF Follow up time period per chart: none scheduled at his time

## 2018-11-09 ENCOUNTER — Encounter: Payer: BLUE CROSS/BLUE SHIELD | Admitting: Women's Health

## 2020-07-10 NOTE — Congregational Nurse Program (Signed)
CN office visit with interpreter Diu Hartshorn assisting.  States she would like to apply for an orange card.  She has no insurance and no medical provider.  We helped patient complete application which she will drop off at [employer].  Brantley Fling RN, Congregational Nurse [phone]

## 2024-05-14 ENCOUNTER — Ambulatory Visit (HOSPITAL_COMMUNITY)
Admission: EM | Admit: 2024-05-14 | Discharge: 2024-05-14 | Disposition: A | Discharge status: Home / Self Care | Attending: Internal Medicine | Admitting: Internal Medicine

## 2024-05-14 ENCOUNTER — Encounter (HOSPITAL_COMMUNITY): Payer: Self-pay

## 2024-05-14 DIAGNOSIS — R21 Rash and other nonspecific skin eruption: Secondary | ICD-10-CM

## 2024-05-14 DIAGNOSIS — T7840XA Allergy, unspecified, initial encounter: Secondary | ICD-10-CM | POA: Diagnosis not present

## 2024-05-14 MED ORDER — FAMOTIDINE 20 MG PO TABS: 20.0000 mg | ORAL_TABLET | Freq: Every day | ORAL | 0 refills | Status: AC

## 2024-05-14 MED ORDER — PREDNISONE 20 MG PO TABS: 40.0000 mg | ORAL_TABLET | Freq: Every day | ORAL | 0 refills | Status: AC

## 2024-05-14 MED ORDER — CETIRIZINE HCL 10 MG PO TABS
10.0000 mg | ORAL_TABLET | Freq: Every day | ORAL | 0 refills | Status: AC
Start: 2024-05-14 — End: 2024-05-21

## 2024-05-14 NOTE — ED Triage Notes (Signed)
 Patient here today with c/o itchy, red rash on the right side of her face since Friday.

## 2024-05-14 NOTE — Discharge Instructions (Addendum)
 Hm nay b?n ? ???c ?nh gi v? ph?n ?ng d? ?ng ch?a r nguyn nhn. U?ng 2 vin prednisone  (40mg ) m?i ngy m?t l?n vo bu?i sng trong 5 bu?i sng ti?p theo cng v?i b?a sng. Khng dng thu?c khng vim khng steroid (NSAID) (ibuprofen , aspirin, aleve ) khi ?ang dng prednisone .  U?ng cetirizine  (zyrtec ) 10mg  v famotidine  (pepcid ) 20mg  tr??c khi ?i ng? m?i t?i trong 7 ngy. Nh?ng lo?i thu?c ny s? gip ?c ch? thm ph?n ?ng d? ?ng.  B?n c th? ??t l?ch h?n ti khm v?i bc s? [place] khoa d? ?ng ???c li?t k trong h? s? b?nh n c?a b?n n?u c?n.  Vui lng ??t l?ch h?n v?i bc s? ch?m [place] s?c kh?e ban ??u c?a b?n ?? ???c theo di v qu?n l lin t?c. Hy quay l?i n?u b?n b? pht ban, kh th? ho?c kh nu?t, s?ng mi/mi?ng/l??i, nn m?a ho?c b?t k? tri?u ch?ng ?ng lo ng?i no khc. N?u cc tri?u ch?ng nghim tr?ng, vui lng ??n phng c?p c?u ?? ???c ki?m tra thm.  You have been evaluated today for an allergic reaction of unknown cause.  Take prednisone  2 pills (40mg ) once daily each morning for the next 5 mornings with breakfast. No NSAIDs (ibuprofen , aspirin, aleve ) when taking prednisone .   Take cetirizine  (zyrtec ) 10mg  and famotidine  (pepcid ) 20mg  at bedtime each night for 7 days. These medicines will help to further suppress the allergic reaction.   You may schedule a follow-up appointment with the allergy specialist listed on your paperwork as needed.  Please schedule an appointment with your primary care provider for follow-up and ongoing management. Return if you experience rashes, difficulty breathing or swallowing, lip/mouth/tongue swelling, vomiting, or for any other concerning symptoms. If symptoms are severe, please go to the ER for further workup.

## 2024-05-14 NOTE — ED Provider Notes (Addendum)
 [facility]    CSN: [id number] Arrival date & time: 05/14/24  1001      History   Chief Complaint Chief Complaint  Patient presents with   Rash    HPI Leah Monroe is a 43 y.o. female.   Leah Monroe is a 43 y.o. female presenting for chief complaint of rash to the right face and right neck that started 2 days ago on Friday, May 11, 2024.  Rash is very itchy and has caused a small amount of swelling to the right side of the face.  Denies pain to the rash.  No drainage from the eyes.  Denies vision changes, eye redness, rhinorrhea, throat swelling, tongue swelling, shortness of breath, neck pain, and recent exposures to known allergens.  No recent changes in lotions, personal hygiene products, body washes, make-up, perfume, laundry detergent, etc.  No recent sick contacts with similar rash.  No recent new medications or foods.  No recent contact with poisonous plants.  Denies fever, chills, body aches, and malaise.  She took Tylenol  at home with minimal relief.  The history is provided by the patient. No language interpreter was used Recruitment consultant [place] medical interpreter, patient declined.).    Past Medical History:  Diagnosis Date   Acne     Patient Active Problem List   Diagnosis Date Noted   Sore throat 07/06/2017   Acute pharyngitis 07/06/2017   Acne 10/31/2015    History reviewed. No pertinent surgical history.  OB History     Gravida  1   Para  1   Term      Preterm      AB      Living  1      SAB      IAB      Ectopic      Multiple      Live Births               Home Medications    Prior to Admission medications   Medication Sig Start Date End Date Taking? Authorizing Provider  cetirizine  (ZYRTEC ) 10 MG tablet Take 1 tablet (10 mg total) by mouth at bedtime for 7 days. 05/14/24 05/21/24 Yes StanhopeDorna HERO, FNP  famotidine  (PEPCID ) 20 MG tablet Take 1 tablet (20 mg total) by mouth at bedtime. 05/14/24  Yes Enedelia Dorna HERO, FNP  predniSONE  (DELTASONE ) 20 MG tablet Take 2 tablets (40 mg total) by mouth daily with breakfast for 5 days. 05/14/24 05/19/24 Yes StanhopeDorna HERO, FNP    Family History History reviewed. No pertinent family history.  Social History Social History   Tobacco Use   Smoking status: Never   Smokeless tobacco: Never  Vaping Use   Vaping status: Never Used  Substance Use Topics   Alcohol use: Yes   Drug use: No     Allergies   Patient has no known allergies.   Review of Systems Review of Systems Per HPI  Physical Exam Triage Vital Signs ED Triage Vitals  Encounter Vitals Group     BP 05/14/24 1014 (!) 141/93     Girls Systolic BP Percentile --      Girls Diastolic BP Percentile --      Boys Systolic BP Percentile --      Boys Diastolic BP Percentile --      Pulse Rate 05/14/24 1014 83     Resp 05/14/24 1014 16     Temp 05/14/24 1014 98.7 F (37.1 C)  Temp Source 05/14/24 1014 Oral     SpO2 05/14/24 1014 98 %     Weight --      Height --      Head Circumference --      Peak Flow --      Pain Score 05/14/24 1016 0     Pain Loc --      Pain Education --      Exclude from Growth Chart --    No data found.  Updated Vital Signs BP (!) 141/93 (BP Location: Right Arm)   Pulse 83   Temp 98.7 F (37.1 C) (Oral)   Resp 16   LMP 04/14/2024 (Exact Date)   SpO2 98%   Visual Acuity Right Eye Distance:   Left Eye Distance:   Bilateral Distance:    Right Eye Near:   Left Eye Near:    Bilateral Near:     Physical Exam Vitals and nursing note reviewed.  Constitutional:      Appearance: She is not ill-appearing or toxic-appearing.  HENT:     Head: Normocephalic and atraumatic.     Right Ear: Hearing and external ear normal.     Left Ear: Hearing and external ear normal.     Nose: Nose normal.     Mouth/Throat:     Lips: Pink.  Eyes:     General: Lids are normal. Vision grossly intact. Gaze aligned appropriately.        Right eye: No foreign body,  discharge or hordeolum.        Left eye: No foreign body, discharge or hordeolum.     Extraocular Movements: Extraocular movements intact.     Conjunctiva/sclera: Conjunctivae normal.     Right eye: Right conjunctiva is not injected.     Left eye: Left conjunctiva is not injected.     Pupils: Pupils are equal, round, and reactive to light.     Visual Fields: Right eye visual fields normal and left eye visual fields normal.     Comments: EOMs intact without pain or dizziness elicited.  Pulmonary:     Effort: Pulmonary effort is normal.  Musculoskeletal:     Cervical back: Neck supple.  Skin:    General: Skin is warm and dry.     Capillary Refill: Capillary refill takes less than 2 seconds.     Findings: Rash (Splotchy maculopapular erythematous rash to the right side of the face and the right neck as seen in image below.  Soft tissue swelling to the right face.) present.  Neurological:     General: No focal deficit present.     Mental Status: She is alert and oriented to person, place, and time. Mental status is at baseline.     Cranial Nerves: No dysarthria or facial asymmetry.  Psychiatric:        Mood and Affect: Mood normal.        Speech: Speech normal.        Behavior: Behavior normal.        Thought Content: Thought content normal.        Judgment: Judgment normal.    Right facial rash/lesions to right neck   UC Treatments / Results  Labs (all labs ordered are listed, but only abnormal results are displayed) Labs Reviewed - No data to display  EKG   Radiology No results found.  Procedures Procedures (including critical care time)  Medications Ordered in UC Medications - No data to display  Initial Impression / Assessment  and Plan / UC Course  I have reviewed the triage vital signs and the nursing notes.  Pertinent labs & imaging results that were available during my care of the patient were reviewed by me and considered in my medical decision making (see  chart for details).   1.  Facial rash, allergic reaction Presentation consistent with acute hypersensitivity reaction, likely acute allergic reaction to unknown substance. No signs of shingles/viral causes of rash. No ocular involvement. No signs of anaphylaxis, HEENT exam stable, lungs clear.  Will treat with steroids, antihistamines, H2 blocker (famotidine ) and supportive care as outlined in AVS. Advised to avoid known and potential allergens. Follow-up with PCP and/or allergy specialist.   Counseled patient on potential for adverse effects with medications prescribed/recommended today, strict ER and return-to-clinic precautions discussed, patient verbalized understanding.    Final Clinical Impressions(s) / UC Diagnoses   Final diagnoses:  Facial rash  Allergic reaction, initial encounter     Discharge Instructions      Hm nay b?n ? ???c ?nh gi v? ph?n ?ng d? ?ng ch?a r nguyn nhn. U?ng 2 vin prednisone  (40mg ) m?i ngy m?t l?n vo bu?i sng trong 5 bu?i sng ti?p theo cng v?i b?a sng. Khng dng thu?c khng vim khng steroid (NSAID) (ibuprofen , aspirin, aleve ) khi ?ang dng prednisone .  U?ng cetirizine  (zyrtec ) 10mg  v famotidine  (pepcid ) 20mg  tr??c khi ?i ng? m?i t?i trong 7 ngy. Nh?ng lo?i thu?c ny s? gip ?c ch? thm ph?n ?ng d? ?ng.  B?n c th? ??t l?ch h?n ti khm v?i bc s? [place] khoa d? ?ng ???c li?t k trong h? s? b?nh n c?a b?n n?u c?n.  Vui lng ??t l?ch h?n v?i bc s? ch?m [place] s?c kh?e ban ??u c?a b?n ?? ???c theo di v qu?n l lin t?c. Hy quay l?i n?u b?n b? pht ban, kh th? ho?c kh nu?t, s?ng mi/mi?ng/l??i, nn m?a ho?c b?t k? tri?u ch?ng ?ng lo ng?i no khc. N?u cc tri?u ch?ng nghim tr?ng, vui lng ??n phng c?p c?u ?? ???c ki?m tra thm.  You have been evaluated today for an allergic reaction of unknown cause.  Take prednisone  2 pills (40mg ) once daily each morning for the next 5 mornings with breakfast. No NSAIDs (ibuprofen , aspirin,  aleve ) when taking prednisone .   Take cetirizine  (zyrtec ) 10mg  and famotidine  (pepcid ) 20mg  at bedtime each night for 7 days. These medicines will help to further suppress the allergic reaction.   You may schedule a follow-up appointment with the allergy specialist listed on your paperwork as needed.  Please schedule an appointment with your primary care provider for follow-up and ongoing management. Return if you experience rashes, difficulty breathing or swallowing, lip/mouth/tongue swelling, vomiting, or for any other concerning symptoms. If symptoms are severe, please go to the ER for further workup.    ED Prescriptions     Medication Sig Dispense Auth. Provider   predniSONE  (DELTASONE ) 20 MG tablet Take 2 tablets (40 mg total) by mouth daily with breakfast for 5 days. 10 tablet Enedelia Dorna HERO, FNP   cetirizine  (ZYRTEC ) 10 MG tablet Take 1 tablet (10 mg total) by mouth at bedtime for 7 days. 7 tablet Enedelia Dorna HERO, FNP   famotidine  (PEPCID ) 20 MG tablet Take 1 tablet (20 mg total) by mouth at bedtime. 7 tablet Enedelia Dorna HERO, FNP      PDMP not reviewed this encounter.   Enedelia Dorna HERO, FNP 05/14/24 1041    Enedelia Dorna HERO, [place] 05/14/24 1041
